# Patient Record
Sex: Male | Born: 1955 | Race: Black or African American | Hispanic: No | Marital: Married | Smoking: Current every day smoker
Health system: Southern US, Community
[De-identification: ages and names within clinical notes are randomized; demographics above are authoritative.]

## PROBLEM LIST (undated history)

## (undated) DIAGNOSIS — K2270 Barrett's esophagus without dysplasia: Secondary | ICD-10-CM

## (undated) DIAGNOSIS — I428 Other cardiomyopathies: Secondary | ICD-10-CM

## (undated) DIAGNOSIS — K219 Gastro-esophageal reflux disease without esophagitis: Secondary | ICD-10-CM

## (undated) DIAGNOSIS — Z8719 Personal history of other diseases of the digestive system: Secondary | ICD-10-CM

## (undated) DIAGNOSIS — I6523 Occlusion and stenosis of bilateral carotid arteries: Secondary | ICD-10-CM

## (undated) DIAGNOSIS — N289 Disorder of kidney and ureter, unspecified: Secondary | ICD-10-CM

## (undated) DIAGNOSIS — N201 Calculus of ureter: Secondary | ICD-10-CM

## (undated) DIAGNOSIS — N400 Enlarged prostate without lower urinary tract symptoms: Secondary | ICD-10-CM

## (undated) DIAGNOSIS — Z87438 Personal history of other diseases of male genital organs: Secondary | ICD-10-CM

## (undated) DIAGNOSIS — R350 Frequency of micturition: Secondary | ICD-10-CM

## (undated) DIAGNOSIS — M1990 Unspecified osteoarthritis, unspecified site: Secondary | ICD-10-CM

## (undated) DIAGNOSIS — R3915 Urgency of urination: Secondary | ICD-10-CM

## (undated) DIAGNOSIS — E785 Hyperlipidemia, unspecified: Secondary | ICD-10-CM

## (undated) HISTORY — DX: Gastro-esophageal reflux disease without esophagitis: K21.9

## (undated) HISTORY — DX: Unspecified osteoarthritis, unspecified site: M19.90

## (undated) HISTORY — DX: Hyperlipidemia, unspecified: E78.5

---

## 1995-02-02 HISTORY — PX: INGUINAL HERNIA REPAIR: SUR1180

## 1997-12-27 ENCOUNTER — Encounter: Payer: Self-pay | Admitting: *Deleted

## 1997-12-27 ENCOUNTER — Emergency Department (HOSPITAL_COMMUNITY): Admission: EM | Admit: 1997-12-27 | Discharge: 1997-12-27 | Payer: Self-pay | Admitting: *Deleted

## 2000-04-18 ENCOUNTER — Other Ambulatory Visit: Admission: RE | Admit: 2000-04-18 | Discharge: 2000-04-18 | Payer: Self-pay | Admitting: Internal Medicine

## 2005-09-13 ENCOUNTER — Emergency Department (HOSPITAL_COMMUNITY): Admission: EM | Admit: 2005-09-13 | Discharge: 2005-09-13 | Payer: Self-pay | Admitting: Family Medicine

## 2006-06-03 ENCOUNTER — Ambulatory Visit: Payer: Self-pay | Admitting: Internal Medicine

## 2006-06-22 ENCOUNTER — Encounter: Payer: Self-pay | Admitting: Internal Medicine

## 2006-06-22 ENCOUNTER — Ambulatory Visit: Payer: Self-pay | Admitting: Internal Medicine

## 2007-04-28 ENCOUNTER — Ambulatory Visit: Payer: Self-pay | Admitting: Internal Medicine

## 2007-05-04 ENCOUNTER — Ambulatory Visit: Payer: Self-pay | Admitting: Internal Medicine

## 2007-05-04 ENCOUNTER — Encounter: Payer: Self-pay | Admitting: Internal Medicine

## 2007-06-07 ENCOUNTER — Ambulatory Visit: Payer: Self-pay | Admitting: Internal Medicine

## 2007-06-07 DIAGNOSIS — K759 Inflammatory liver disease, unspecified: Secondary | ICD-10-CM | POA: Insufficient documentation

## 2007-06-07 DIAGNOSIS — K219 Gastro-esophageal reflux disease without esophagitis: Secondary | ICD-10-CM | POA: Insufficient documentation

## 2007-06-07 DIAGNOSIS — Z860100 Personal history of colon polyps, unspecified: Secondary | ICD-10-CM | POA: Insufficient documentation

## 2007-06-07 DIAGNOSIS — Z8719 Personal history of other diseases of the digestive system: Secondary | ICD-10-CM

## 2007-06-07 DIAGNOSIS — N400 Enlarged prostate without lower urinary tract symptoms: Secondary | ICD-10-CM

## 2007-06-07 LAB — CONVERTED CEMR LAB
ALT: 27 units/L (ref 0–53)
AST: 21 units/L (ref 0–37)
Albumin: 3.8 g/dL (ref 3.5–5.2)
Alkaline Phosphatase: 54 units/L (ref 39–117)
BUN: 8 mg/dL (ref 6–23)
Basophils Absolute: 0 10*3/uL (ref 0.0–0.1)
Basophils Relative: 0.6 % (ref 0.0–1.0)
Bilirubin Urine: NEGATIVE
Bilirubin, Direct: 0.2 mg/dL (ref 0.0–0.3)
CO2: 30 meq/L (ref 19–32)
Calcium: 9.2 mg/dL (ref 8.4–10.5)
Chloride: 108 meq/L (ref 96–112)
Cholesterol: 210 mg/dL (ref 0–200)
Creatinine, Ser: 0.9 mg/dL (ref 0.4–1.5)
Direct LDL: 155.5 mg/dL
Eosinophils Absolute: 0.2 10*3/uL (ref 0.0–0.7)
Eosinophils Relative: 2.6 % (ref 0.0–5.0)
GFR calc Af Amer: 115 mL/min
GFR calc non Af Amer: 95 mL/min
Glucose, Bld: 84 mg/dL (ref 70–99)
HCT: 42.7 % (ref 39.0–52.0)
HDL: 31 mg/dL — ABNORMAL LOW (ref >39.0)
Hemoglobin, Urine: NEGATIVE
Hemoglobin: 14.9 g/dL (ref 13.0–17.0)
Ketones, ur: NEGATIVE mg/dL
Leukocytes, UA: NEGATIVE
Lymphocytes Relative: 31.9 % (ref 12.0–46.0)
MCHC: 34.8 g/dL (ref 30.0–36.0)
MCV: 94.4 fL (ref 78.0–100.0)
Monocytes Absolute: 0.4 10*3/uL (ref 0.1–1.0)
Monocytes Relative: 5.3 % (ref 3.0–12.0)
Neutro Abs: 5 10*3/uL (ref 1.4–7.7)
Neutrophils Relative %: 59.6 % (ref 43.0–77.0)
Nitrite: NEGATIVE
PSA: 0.69 ng/mL (ref 0.10–4.00)
Platelets: 235 10*3/uL (ref 150–400)
Potassium: 3.9 meq/L (ref 3.5–5.1)
RBC: 4.52 M/uL (ref 4.22–5.81)
RDW: 12.1 % (ref 11.5–14.6)
Sodium: 141 meq/L (ref 135–145)
Specific Gravity, Urine: <=1.005 (ref 1.000–1.03)
TSH: 0.52 microintl units/mL (ref 0.35–5.50)
Total Bilirubin: 1.3 mg/dL — ABNORMAL HIGH (ref 0.3–1.2)
Total CHOL/HDL Ratio: 6.8
Total Protein, Urine: NEGATIVE mg/dL
Total Protein: 6.6 g/dL (ref 6.0–8.3)
Triglycerides: 79 mg/dL (ref 0–149)
Urine Glucose: NEGATIVE mg/dL
Urobilinogen, UA: 0.2 (ref 0.0–1.0)
VLDL: 16 mg/dL (ref 0–40)
WBC: 8.2 10*3/uL (ref 4.5–10.5)
pH: 6 (ref 5.0–8.0)

## 2008-03-27 ENCOUNTER — Ambulatory Visit: Payer: Self-pay | Admitting: Internal Medicine

## 2008-03-27 DIAGNOSIS — J069 Acute upper respiratory infection, unspecified: Secondary | ICD-10-CM | POA: Insufficient documentation

## 2008-03-27 DIAGNOSIS — N410 Acute prostatitis: Secondary | ICD-10-CM

## 2008-03-27 DIAGNOSIS — R42 Dizziness and giddiness: Secondary | ICD-10-CM

## 2008-03-27 DIAGNOSIS — E785 Hyperlipidemia, unspecified: Secondary | ICD-10-CM | POA: Insufficient documentation

## 2008-03-27 LAB — CONVERTED CEMR LAB
ALT: 20 units/L (ref 0–53)
AST: 19 units/L (ref 0–37)
Albumin: 4 g/dL (ref 3.5–5.2)
Alkaline Phosphatase: 59 units/L (ref 39–117)
BUN: 11 mg/dL (ref 6–23)
Basophils Absolute: 0.3 10*3/uL — ABNORMAL HIGH (ref 0.0–0.1)
Basophils Relative: 3.6 % — ABNORMAL HIGH (ref 0.0–3.0)
Bilirubin, Direct: 0.1 mg/dL (ref 0.0–0.3)
CO2: 29 meq/L (ref 19–32)
Calcium: 9.3 mg/dL (ref 8.4–10.5)
Chloride: 105 meq/L (ref 96–112)
Creatinine, Ser: 1 mg/dL (ref 0.4–1.5)
Eosinophils Absolute: 0.2 10*3/uL (ref 0.0–0.7)
Eosinophils Relative: 2.5 % (ref 0.0–5.0)
GFR calc Af Amer: 101 mL/min
GFR calc non Af Amer: 84 mL/min
Glucose, Bld: 98 mg/dL (ref 70–99)
HCT: 42.8 % (ref 39.0–52.0)
Hemoglobin: 14.9 g/dL (ref 13.0–17.0)
Lymphocytes Relative: 32.9 % (ref 12.0–46.0)
MCHC: 34.9 g/dL (ref 30.0–36.0)
MCV: 95 fL (ref 78.0–100.0)
Monocytes Absolute: 0.4 10*3/uL (ref 0.1–1.0)
Monocytes Relative: 4.8 % (ref 3.0–12.0)
Neutro Abs: 4.5 10*3/uL (ref 1.4–7.7)
Neutrophils Relative %: 56.2 % (ref 43.0–77.0)
Platelets: 229 10*3/uL (ref 150–400)
Potassium: 4 meq/L (ref 3.5–5.1)
RBC: 4.5 M/uL (ref 4.22–5.81)
RDW: 12.2 % (ref 11.5–14.6)
Sodium: 142 meq/L (ref 135–145)
Total Bilirubin: 0.9 mg/dL (ref 0.3–1.2)
Total Protein: 7.2 g/dL (ref 6.0–8.3)
WBC: 8.1 10*3/uL (ref 4.5–10.5)

## 2008-04-03 ENCOUNTER — Encounter: Payer: Self-pay | Admitting: Internal Medicine

## 2008-04-03 ENCOUNTER — Ambulatory Visit: Payer: Self-pay

## 2008-04-03 HISTORY — PX: CARDIOVASCULAR STRESS TEST: SHX262

## 2008-04-03 HISTORY — PX: TRANSTHORACIC ECHOCARDIOGRAM: SHX275

## 2008-04-04 ENCOUNTER — Encounter: Payer: Self-pay | Admitting: Internal Medicine

## 2008-04-04 DIAGNOSIS — I428 Other cardiomyopathies: Secondary | ICD-10-CM | POA: Insufficient documentation

## 2008-04-04 DIAGNOSIS — I420 Dilated cardiomyopathy: Secondary | ICD-10-CM | POA: Insufficient documentation

## 2008-04-05 ENCOUNTER — Telehealth: Payer: Self-pay | Admitting: Internal Medicine

## 2008-04-09 ENCOUNTER — Telehealth (INDEPENDENT_AMBULATORY_CARE_PROVIDER_SITE_OTHER): Payer: Self-pay | Admitting: *Deleted

## 2008-04-10 ENCOUNTER — Encounter: Payer: Self-pay | Admitting: Cardiology

## 2008-04-10 ENCOUNTER — Ambulatory Visit: Payer: Self-pay

## 2008-05-07 ENCOUNTER — Encounter: Payer: Self-pay | Admitting: Cardiology

## 2008-05-07 ENCOUNTER — Ambulatory Visit: Payer: Self-pay | Admitting: Cardiology

## 2008-05-07 DIAGNOSIS — F17200 Nicotine dependence, unspecified, uncomplicated: Secondary | ICD-10-CM | POA: Insufficient documentation

## 2008-05-08 ENCOUNTER — Ambulatory Visit: Payer: Self-pay | Admitting: Cardiology

## 2008-05-08 LAB — CONVERTED CEMR LAB
ALT: 17 units/L (ref 0–53)
AST: 18 units/L (ref 0–37)
Albumin: 3.5 g/dL (ref 3.5–5.2)
Alkaline Phosphatase: 53 units/L (ref 39–117)
BUN: 10 mg/dL (ref 6–23)
Basophils Absolute: 0 10*3/uL (ref 0.0–0.1)
Basophils Relative: 0.3 % (ref 0.0–3.0)
Bilirubin, Direct: 0.1 mg/dL (ref 0.0–0.3)
CO2: 28 meq/L (ref 19–32)
Calcium: 9.1 mg/dL (ref 8.4–10.5)
Chloride: 112 meq/L (ref 96–112)
Cholesterol: 194 mg/dL (ref 0–200)
Creatinine, Ser: 0.9 mg/dL (ref 0.4–1.5)
Eosinophils Absolute: 0.2 10*3/uL (ref 0.0–0.7)
Eosinophils Relative: 1.9 % (ref 0.0–5.0)
GFR calc non Af Amer: 114.35 mL/min (ref >60)
Glucose, Bld: 108 mg/dL — ABNORMAL HIGH (ref 70–99)
HCT: 42.2 % (ref 39.0–52.0)
HDL: 37.6 mg/dL — ABNORMAL LOW (ref >39.00)
Hemoglobin: 14.4 g/dL (ref 13.0–17.0)
LDL Cholesterol: 139 mg/dL — ABNORMAL HIGH (ref 0–99)
Lymphocytes Relative: 19.8 % (ref 12.0–46.0)
Lymphs Abs: 2 10*3/uL (ref 0.7–4.0)
MCHC: 34.2 g/dL (ref 30.0–36.0)
MCV: 95.9 fL (ref 78.0–100.0)
Monocytes Absolute: 0.6 10*3/uL (ref 0.1–1.0)
Monocytes Relative: 5.8 % (ref 3.0–12.0)
Neutro Abs: 7.5 10*3/uL (ref 1.4–7.7)
Neutrophils Relative %: 72.2 % (ref 43.0–77.0)
Platelets: 189 10*3/uL (ref 150.0–400.0)
Potassium: 3.9 meq/L (ref 3.5–5.1)
Pro B Natriuretic peptide (BNP): 33 pg/mL (ref 0.0–100.0)
RBC: 4.4 M/uL (ref 4.22–5.81)
RDW: 12.1 % (ref 11.5–14.6)
Sodium: 144 meq/L (ref 135–145)
Total Bilirubin: 1.2 mg/dL (ref 0.3–1.2)
Total CHOL/HDL Ratio: 5
Total Protein: 6.8 g/dL (ref 6.0–8.3)
Triglycerides: 85 mg/dL (ref 0.0–149.0)
VLDL: 17 mg/dL (ref 0.0–40.0)
WBC: 10.3 10*3/uL (ref 4.5–10.5)

## 2008-05-13 ENCOUNTER — Telehealth: Payer: Self-pay | Admitting: Cardiology

## 2008-05-14 ENCOUNTER — Ambulatory Visit: Payer: Self-pay | Admitting: Cardiology

## 2008-05-14 LAB — CONVERTED CEMR LAB
INR: 0.9 (ref 0.8–1.0)
Prothrombin Time: 10.1 s — ABNORMAL LOW (ref 10.9–13.3)

## 2008-05-15 ENCOUNTER — Inpatient Hospital Stay (HOSPITAL_BASED_OUTPATIENT_CLINIC_OR_DEPARTMENT_OTHER): Admission: RE | Admit: 2008-05-15 | Discharge: 2008-05-15 | Payer: Self-pay | Admitting: Cardiology

## 2008-05-15 ENCOUNTER — Ambulatory Visit: Payer: Self-pay | Admitting: Cardiology

## 2008-05-15 HISTORY — PX: CARDIAC CATHETERIZATION: SHX172

## 2008-05-16 ENCOUNTER — Telehealth: Payer: Self-pay | Admitting: Cardiology

## 2008-06-26 ENCOUNTER — Encounter (INDEPENDENT_AMBULATORY_CARE_PROVIDER_SITE_OTHER): Payer: Self-pay | Admitting: *Deleted

## 2008-08-02 ENCOUNTER — Telehealth: Payer: Self-pay | Admitting: Internal Medicine

## 2009-04-15 ENCOUNTER — Encounter: Payer: Self-pay | Admitting: Internal Medicine

## 2009-04-15 DIAGNOSIS — I6529 Occlusion and stenosis of unspecified carotid artery: Secondary | ICD-10-CM

## 2009-04-16 ENCOUNTER — Ambulatory Visit: Payer: Self-pay

## 2009-04-16 ENCOUNTER — Encounter: Payer: Self-pay | Admitting: Internal Medicine

## 2009-07-04 ENCOUNTER — Encounter (INDEPENDENT_AMBULATORY_CARE_PROVIDER_SITE_OTHER): Payer: Self-pay | Admitting: *Deleted

## 2010-03-03 NOTE — Letter (Signed)
Summary: Endoscopy Letter  [facility]  [address] [place], [place] [address]   Phone: [phone]  Fax: [phone]      July 04, 2009 MRN: [id number]   Jeffery Frye [address] [place], [place]  [address]   Dear [name],   According to your medical record, it is time for you to schedule an Endoscopy. Endoscopic screening is recommended for patients with certain upper digestive tract conditions because of associated increased risk for cancers of the upper digestive system.  This letter has been generated based on the recommendations made at the time of your prior procedure. If you feel that in your particular situation this may no longer apply, please contact our office.  Please call our office at [phone]) to schedule this appointment or to update your records at your earliest convenience.  Thank you for cooperating with [place] to provide you with the very best care possible.   Sincerely,  Wilhemina Bonito. Marina Goodell, M.D.  [facility] [phone]

## 2010-03-03 NOTE — Progress Notes (Signed)
Summary: questin when he can return to work.  Phone Note Call from Patient Call back at [phone]   Summary of Call: when can he take off bandages from procedure yesterday/lg Initial call taken by: Omer Jack,  May 16, 2008 2:11 PM  Follow-up for Phone Call        Pt. post cardiac cath done 05/15/08  Let pt. know he can remove groing bandage now replace it with a bandaid. Pt. has question when he can return to work Ollen Gross, RN, BSN  May 16, 2008 2:34 PM   Additional Follow-up for Phone Call Additional follow up Details #1::        Lm for pt to call me back. Pt can return to work Monday April 19,2010. Note left at desk for pt to return to work Katina Dung, RN, BSN  May 17, 2008 5:05 PM  talked with patient-aware he can return to [facility] April 19,2010  Additional Follow-up by: Katina Dung, RN, BSN,  May 20, 2008 12:00 PM

## 2010-03-03 NOTE — Miscellaneous (Signed)
Summary: Orders Update  Clinical Lists Changes  Problems: Added new problem of CAROTID ARTERY DISEASE (ICD-433.10) Orders: Added new Test order of Carotid Duplex (Carotid Duplex) - Signed 

## 2010-06-16 NOTE — Cardiovascular Report (Signed)
NAME:  CUAHUTEMOC, ATTAR NO.:  [id number]   MEDICAL RECORD NO.:  [id number]          PATIENT TYPE:  OIB   LOCATION:  1963                         FACILITY:  [facility]   PHYSICIAN:  Marca Ancona, MD      DATE OF BIRTH:  13-Oct-1955   DATE OF PROCEDURE:  DATE OF DISCHARGE:  05/15/2008                            CARDIAC CATHETERIZATION   PRIMARY CARE PHYSICIAN:  Corwin Levins, MD   INDICATIONS:  This is a 55 year old with a history of mildly depressed  LV systolic function and a fixed inferior defect on Myoview.  We are  proceeding with his heart catheterization to rule out coronary artery  disease as a cause of this mild cardiomyopathy.   PROCEDURE:  1. Left heart catheterization.  2. Coronary angiography  3. Left ventriculography.   PROCEDURE NOTE:  After informed consent was obtained, the right groin  was sterilely prepped and draped.  A 4-French arterial sheath was placed  using Seldinger technique in the right common femoral artery.  The left  coronary artery was engaged using the 4-French JL-4 catheter.  The right  coronary artery was engaged using the 4-French 3 DRC catheter and the  left ventricle was entered using the 4-French angled pigtail catheter.   FINDINGS:  1. Hemodynamics:  LV 102/14/15, aorta 100/65.  2. Left ventriculography:  EF was estimated to be 40-45%.  There was      mild global hypokinesis.  This did appear possibly worse      inferiorly.  There was no significant mitral regurgitation.  3. Coronary angiography:  The coronary system is right dominant.      There was no angiographic coronary artery disease noted.   ASSESSMENT/PLAN:  This is a 55 year old with a history of a mildly  depressed left ventricular systolic function and an inferior defect on  Myoview.  Heart catheterization shows no angiographic coronary artery  disease.  The left ventriculogram shows mildly depressed global left  ventricular systolic function with an ejection  fraction estimated to be  40-45%.  The patient does appear to have a nonischemic cardiomyopathy.  He should continue on his carvedilol.  We will likely start him on an  ACE inhibitor at a low dose at our next appointment, and I will see him  back in 10-14 days.  His HIV, ANA, and SPEP of note have been negative.  We may consider for cardiac MRI if sarcoid is felt to be likely, but I  will do further evaluation before this.     Marca Ancona, MD  Electronically Signed    DM/MEDQ  D:  05/15/2008  T:  05/16/2008  Job:  [id number]   cc:   Corwin Levins, MD

## 2010-06-19 NOTE — Assessment & Plan Note (Signed)
Jeffery Frye                         GASTROENTEROLOGY OFFICE NOTE   Doctor, Sheahan JACOBEY GURA                     MRN:          [id number]  DATE:06/03/2006                            DOB:          1955-05-15    HISTORY:  Jeffery Frye presents today requesting prescription for  Protonix.  He is a 55 year old gentleman who was followed in this office  remotely for gastroesophageal reflux disease, and possible Barrett's  esophagus.  Upper endoscopy April 15, 2000 showed a nodule at the  gastroesophageal junction, which was biopsied, and was said to show  intestinal metaplasia consistent with Barrett's esophagus, and  associated with glandular atypia.  His proton pump inhibitor was  increased, and followup endoscopy in July of 2003 revealed benign  gastric mucosa without Barrett's.  Followup in 2 years recommended.  The  patient had not been seen since that time, and had been last followup.  He received a recall letter, but failed to reschedule.  He has been off  proton pump inhibitors for some time.  Recently developed problems with  chest pain.  Went to Urgent Care, and was told this was due to reflux.  He was placed on Protonix, which has ameliorated his symptoms.  He  requests a refill.  He denies dysphagia.  He has had some slight weight  gain.  No other significant medical problems or active GI symptoms.  He  does mention that he is interested in having a screening colonoscopy at  age 35.  He denies change in bowel habits or bleeding.  There is no  family history of colon cancer.   PAST MEDICAL HISTORY:  Reflux disease.   PAST SURGICAL HISTORY:  None.   CURRENT MEDICATIONS:  Protonix 40 mg daily.   ALLERGIES:  NO KNOWN DRUG ALLERGIES.   FAMILY HISTORY:  Negative for gastrointestinal malignancy.  Father with  heart disease.   SOCIAL HISTORY:  The patient smokes and uses alcohol.  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   REVIEW OF SYSTEMS:  Per diagnostic evaluation form.   PHYSICAL EXAMINATION:  Well-appearing male, in no acute distress.  Blood pressure 118/82.  Heart rate is 80.  Weight is 208 pounds.  He is  5 feet 9 inches in height.  HEENT:  Sclerae anicteric.  Conjunctivae are pink.  Oral mucosa intact.  No adenopathy.  LUNGS:  Clear.  HEART:  Regular.  ABDOMEN:  Soft and non-tender.  There is no mass or hernia.   IMPRESSION:  1. Gastroesophageal reflux disease.  Prior endoscopy with inflammatory      nodule at the gastroesophageal junction, as well, a question of      Barrett's esophagus with atypia.  2. Colonoscopy cancer screening.  Due next year.  Patient aware.   PLAN:  1. Protonix 40 mg daily.  A prescription with multiple refills has      been provided, in addition to a few samples.  2. Schedule upper endoscopy with biopsies to follow up question of      Barrett's.  Ashby Dawes of the procedure, as well  as the risks,      benefits, and alternatives have been reviewed.  He understood, and      agreed to proceed.  3. Screening colonoscopy next year.     Jeffery Frye. Marina Goodell, MD  Electronically Signed    JNP/MedQ  DD: 06/03/2006  DT: 06/03/2006  Job #: [id number]

## 2010-07-08 ENCOUNTER — Other Ambulatory Visit: Payer: Self-pay | Admitting: Cardiology

## 2010-07-16 ENCOUNTER — Other Ambulatory Visit: Payer: Self-pay | Admitting: *Deleted

## 2010-07-16 MED ORDER — SIMVASTATIN 40 MG PO TABS: 40.0000 mg | ORAL_TABLET | Freq: Every evening | ORAL | Status: DC

## 2010-08-19 ENCOUNTER — Telehealth: Payer: Self-pay | Admitting: *Deleted

## 2010-08-19 NOTE — Telephone Encounter (Signed)
08-19-2010 Recall project: called pt's home number [phone], wrong number, [place] residence. Called it twice to be sure same person.  Called mobile number [phone], got answering machine with Manager Caryn Bee. I left a message asking if Mr. Isack Lavalley still works there please have him him call me. Work number we had was [phone].

## 2010-08-27 NOTE — Telephone Encounter (Signed)
The patient did call us on 08-21-2010 and one of our scheduler scheduled him for an EGD on 10-14-2010.  He is also scheduled for a previsit with a nurse on 09-30-10.  Packet has been mailed to the patient from our office.

## 2010-09-21 ENCOUNTER — Inpatient Hospital Stay (INDEPENDENT_AMBULATORY_CARE_PROVIDER_SITE_OTHER)
Admission: RE | Admit: 2010-09-21 | Discharge: 2010-09-21 | Disposition: A | Discharge status: Home / Self Care | Payer: 59 | Source: Ambulatory Visit | Attending: Family Medicine | Admitting: Family Medicine

## 2010-09-21 DIAGNOSIS — M719 Bursopathy, unspecified: Secondary | ICD-10-CM

## 2010-09-21 DIAGNOSIS — M659 Synovitis and tenosynovitis, unspecified: Secondary | ICD-10-CM

## 2010-09-23 ENCOUNTER — Other Ambulatory Visit: Payer: Self-pay | Admitting: *Deleted

## 2010-09-23 MED ORDER — SIMVASTATIN 40 MG PO TABS: 40.0000 mg | ORAL_TABLET | Freq: Every evening | ORAL | Status: DC

## 2010-09-23 NOTE — Telephone Encounter (Signed)
rx sent in rx, pt needs appt to see Dr. Shirlee Latch for follow up since starting simvastatin and for any further refills. Danielle Rankin

## 2010-10-14 ENCOUNTER — Other Ambulatory Visit: Payer: Self-pay | Admitting: Internal Medicine

## 2010-10-21 ENCOUNTER — Encounter: Payer: Self-pay | Admitting: Internal Medicine

## 2010-10-27 ENCOUNTER — Other Ambulatory Visit: Payer: Self-pay | Admitting: *Deleted

## 2010-10-27 MED ORDER — SIMVASTATIN 40 MG PO TABS: 40.0000 mg | ORAL_TABLET | Freq: Every evening | ORAL | Status: DC

## 2010-11-03 ENCOUNTER — Other Ambulatory Visit: Payer: Self-pay | Admitting: Cardiology

## 2010-11-06 ENCOUNTER — Ambulatory Visit (AMBULATORY_SURGERY_CENTER): Payer: 59 | Admitting: *Deleted

## 2010-11-06 ENCOUNTER — Encounter: Payer: Self-pay | Admitting: Internal Medicine

## 2010-11-06 VITALS — Ht 72.0 in | Wt 217.0 lb

## 2010-11-06 DIAGNOSIS — K2270 Barrett's esophagus without dysplasia: Secondary | ICD-10-CM

## 2010-11-20 ENCOUNTER — Encounter: Payer: Self-pay | Admitting: Internal Medicine

## 2010-11-20 ENCOUNTER — Ambulatory Visit (AMBULATORY_SURGERY_CENTER): Payer: 59 | Admitting: Internal Medicine

## 2010-11-20 VITALS — BP 123/48 | HR 69 | Temp 96.6°F | Resp 17 | Ht 72.0 in | Wt 217.0 lb

## 2010-11-20 DIAGNOSIS — K209 Esophagitis, unspecified: Secondary | ICD-10-CM

## 2010-11-20 DIAGNOSIS — K2980 Duodenitis without bleeding: Secondary | ICD-10-CM

## 2010-11-20 DIAGNOSIS — K2900 Acute gastritis without bleeding: Secondary | ICD-10-CM

## 2010-11-20 DIAGNOSIS — K2270 Barrett's esophagus without dysplasia: Secondary | ICD-10-CM

## 2010-11-20 DIAGNOSIS — K219 Gastro-esophageal reflux disease without esophagitis: Secondary | ICD-10-CM

## 2010-11-20 MED ORDER — SODIUM CHLORIDE 0.9 % IV SOLN: 500.0000 mL | INTRAVENOUS | Status: DC

## 2010-11-20 NOTE — Patient Instructions (Signed)
Discharge instructions given with verbal understanding.  Handouts on Barretts, GERD, and a soft diet given.  Resume previous medications.

## 2010-11-23 ENCOUNTER — Telehealth: Payer: Self-pay | Admitting: *Deleted

## 2010-11-23 DIAGNOSIS — K2900 Acute gastritis without bleeding: Secondary | ICD-10-CM

## 2010-11-23 LAB — HELICOBACTER PYLORI SCREEN-BIOPSY: UREASE: NEGATIVE

## 2010-11-23 NOTE — Telephone Encounter (Signed)

## 2011-03-13 ENCOUNTER — Emergency Department (HOSPITAL_COMMUNITY)
Admission: EM | Admit: 2011-03-13 | Discharge: 2011-03-13 | Disposition: A | Discharge status: Home / Self Care | Payer: 59 | Source: Home / Self Care | Attending: Emergency Medicine | Admitting: Emergency Medicine

## 2011-03-13 ENCOUNTER — Encounter (HOSPITAL_COMMUNITY): Payer: Self-pay | Admitting: *Deleted

## 2011-03-13 DIAGNOSIS — J069 Acute upper respiratory infection, unspecified: Secondary | ICD-10-CM

## 2011-03-13 MED ORDER — ALBUTEROL SULFATE HFA 108 (90 BASE) MCG/ACT IN AERS: 1.0000 | INHALATION_SPRAY | Freq: Four times a day (QID) | RESPIRATORY_TRACT | Status: DC | PRN

## 2011-03-13 NOTE — ED Notes (Signed)
Pt with c/o congestion/cough/fever/bodyaches onset Monday night - fever /bodyaches resolved wed/thurs - cough and congestion continues - sneezing - yellowish sinus drainage

## 2011-03-13 NOTE — ED Provider Notes (Signed)
History     CSN: [id number]  Arrival date & time 03/13/11  1234   First MD Initiated Contact with Patient 03/13/11 1359      Chief Complaint  Patient presents with  . Nasal Congestion    pt with c/o congestion/cough/fever/bodyaches onset monday night - fever/bodyaches resolved wed or thurs - cough and congestion continues/sneezing /yellowish sinus drainage - taking mucinex dm with some relief   . Cough  . Fever  . Generalized Body Aches    (Consider location/radiation/quality/duration/timing/severity/associated sxs/prior treatment) HPI Comments: Pt with rhinorrhea, postnasal drip, ST, nonproductive cough, fatigue. bodyaches x 5 days. Reports some wheezing, but no chest pain or shortness of breath. States that he felt feverish at the beginning the illness, but no measured fevers at home. This has largely resolved, however, he complains of continued cough and wheezing. No ear pain, abd pain, rash, N/V. Slightly decreased appetite but is tolerating po.  Is taking Mucinex DM for cough with symptomatic relief, and states that he is able to sleep at night without problem.  ROS as noted in HPI. All other ROS negative.       Patient is a 56 y.o. male presenting with cough and fever. The history is provided by the patient. No language interpreter was used.  Cough This is a new problem. The current episode started more than 2 days ago. The cough is non-productive. He is a smoker. His past medical history does not include COPD, emphysema or asthma.  Fever Primary symptoms of the febrile illness include fever and cough.    Past Medical History  Diagnosis Date  . GERD (gastroesophageal reflux disease)   . Hyperlipidemia   . CAD (coronary artery disease)   . Arthritis     Past Surgical History  Procedure Date  . Inguinal hernia repair 1997    right    Family History  Problem Relation Age of Onset  . Colon polyps Neg Hx   . Colon cancer Neg Hx   . Rectal cancer Neg Hx   .  Stomach cancer Neg Hx     History  Substance Use Topics  . Smoking status: Current Everyday Smoker -- 1.0 packs/day    Types: Cigarettes  . Smokeless tobacco: Not on file  . Alcohol Use: 1.8 oz/week    3 Cans of beer per week      Review of Systems  Constitutional: Positive for fever.  Respiratory: Positive for cough.     Allergies  Review of patient's allergies indicates no known allergies.  Home Medications   Current Outpatient Rx  Name Route Sig Dispense Refill  . MUCINEX DM PO Oral Take by mouth.    . ALBUTEROL SULFATE HFA 108 (90 BASE) MCG/ACT IN AERS Inhalation Inhale 1-2 puffs into the lungs every 6 (six) hours as needed for wheezing. 1 Inhaler 0  . ASPIRIN 81 MG PO TABS Oral Take 81 mg by mouth daily.      Marland Kitchen CARVEDILOL 6.25 MG PO TABS  TAKE ONE TABLET TWO TIMES A DAY 60 tablet 1  . ONE-DAILY MULTI VITAMINS PO TABS Oral Take 1 tablet by mouth daily.      Marland Kitchen OMEPRAZOLE 20 MG PO CPDR Oral Take 20 mg by mouth daily.      Marland Kitchen SIMVASTATIN 40 MG PO TABS Oral Take 1 tablet (40 mg total) by mouth every evening. 15 tablet 0    Pt needs appt to see Dr. Shirlee Latch for follow up sinc ...    BP 115/86  Pulse 80  Temp(Src) 98.4 F (36.9 C) (Oral)  Resp 18  SpO2 97%  Physical Exam  Nursing note and vitals reviewed. Constitutional: He is oriented to person, place, and time. He appears well-developed and well-nourished.  HENT:  Head: Normocephalic and atraumatic.  Right Ear: Tympanic membrane normal.  Left Ear: Tympanic membrane normal.  Nose: Mucosal edema and rhinorrhea present. No epistaxis.  Mouth/Throat: Uvula is midline and mucous membranes are normal. Posterior oropharyngeal erythema present. No oropharyngeal exudate.       No sinus tenderness  Eyes: Conjunctivae and EOM are normal.  Neck: Normal range of motion. Neck supple.  Cardiovascular: Normal rate, regular rhythm and normal heart sounds.   Pulmonary/Chest: Effort normal and breath sounds normal. No respiratory  distress.  Abdominal: Soft. Bowel sounds are normal. He exhibits no distension. There is no tenderness.  Musculoskeletal: Normal range of motion. He exhibits no edema and no tenderness.  Lymphadenopathy:    He has no cervical adenopathy.  Neurological: He is alert and oriented to person, place, and time.  Skin: Skin is warm and dry. No rash noted.  Psychiatric: He has a normal mood and affect. His behavior is normal. Judgment and thought content normal.    ED Course  Procedures (including critical care time)  Labs Reviewed - No data to display No results found.   1. URI (upper respiratory infection)       MDM  Patient is a smoker, will send home with albuterol to help with his cough. Will have him continue Mucinex DM, increase his fluids. Follow up with Dr. as needed.  Luiz Blare, MD 03/13/11 [phone]

## 2012-03-17 ENCOUNTER — Emergency Department (HOSPITAL_COMMUNITY)
Admission: EM | Admit: 2012-03-17 | Discharge: 2012-03-17 | Disposition: A | Discharge status: Home / Self Care | Payer: 59 | Source: Home / Self Care | Attending: Family Medicine | Admitting: Family Medicine

## 2012-03-17 ENCOUNTER — Encounter (HOSPITAL_COMMUNITY): Payer: Self-pay | Admitting: Emergency Medicine

## 2012-03-17 DIAGNOSIS — J329 Chronic sinusitis, unspecified: Secondary | ICD-10-CM

## 2012-03-17 DIAGNOSIS — J40 Bronchitis, not specified as acute or chronic: Secondary | ICD-10-CM

## 2012-03-17 MED ORDER — ALBUTEROL SULFATE HFA 108 (90 BASE) MCG/ACT IN AERS: 1.0000 | INHALATION_SPRAY | Freq: Four times a day (QID) | RESPIRATORY_TRACT | Status: DC | PRN

## 2012-03-17 MED ORDER — AMOXICILLIN 500 MG PO CAPS: 500.0000 mg | ORAL_CAPSULE | Freq: Three times a day (TID) | ORAL | Status: DC

## 2012-03-17 MED ORDER — HYDROCODONE-ACETAMINOPHEN 7.5-325 MG/15ML PO SOLN: 10.0000 mL | Freq: Three times a day (TID) | ORAL | Status: DC | PRN

## 2012-03-17 MED ORDER — PREDNISONE 20 MG PO TABS: ORAL_TABLET | ORAL | Status: DC

## 2012-03-17 MED ORDER — IBUPROFEN 600 MG PO TABS: 600.0000 mg | ORAL_TABLET | Freq: Three times a day (TID) | ORAL | Status: DC | PRN

## 2012-03-17 NOTE — ED Provider Notes (Signed)
History     CSN: [id number]  Arrival date & time 03/17/12  1145   First MD Initiated Contact with Patient 03/17/12 1224      Chief Complaint  Patient presents with  . URI    (Consider location/radiation/quality/duration/timing/severity/associated sxs/prior treatment) HPI Comments: 57 year old male nondiabetic in the process of quitting cigarette smoking. Here complaining of nasal congestion, sinus pressure, headache and thick yellow-green rhinorrhea worse during the last week although he has had milder symptoms for about 2 weeks. Symptoms associated with headache and decreased appetite. Stated he has felt warm but denies chills or diaphoresis. Patient stated he wakes up in the morning coughing a lot of thick mucus. Denies chest pain or shortness of breath. He has had some wheezing at nighttime. Patient is using any electronic cigarette patient stated he is mixing non-nicotine and nicotine containing cartridges to blindly insert on his electronic cigarette device to wean himself off smoking.   Past Medical History  Diagnosis Date  . GERD (gastroesophageal reflux disease)   . Hyperlipidemia   . CAD (coronary artery disease)   . Arthritis     Past Surgical History  Procedure Laterality Date  . Inguinal hernia repair  1997    right    Family History  Problem Relation Age of Onset  . Colon polyps Neg Hx   . Colon cancer Neg Hx   . Rectal cancer Neg Hx   . Stomach cancer Neg Hx     History  Substance Use Topics  . Smoking status: Current Every Day Smoker -- 1.00 packs/day    Types: Cigarettes  . Smokeless tobacco: Not on file  . Alcohol Use: 1.8 oz/week    3 Cans of beer per week      Review of Systems  Constitutional: Negative for fever and chills.  HENT: Positive for congestion, rhinorrhea and sinus pressure.   Eyes: Negative for pain and discharge.  Respiratory: Positive for cough and wheezing.   Cardiovascular: Negative for chest pain, palpitations and leg  swelling.  Gastrointestinal: Negative for nausea, vomiting and diarrhea.  Skin: Negative for rash.  Neurological: Positive for headaches. Negative for dizziness.  All other systems reviewed and are negative.    Allergies  Review of patient's allergies indicates no known allergies.  Home Medications   Current Outpatient Rx  Name  Route  Sig  Dispense  Refill  . EXPIRED: albuterol (PROVENTIL HFA;VENTOLIN HFA) 108 (90 BASE) MCG/ACT inhaler   Inhalation   Inhale 1-2 puffs into the lungs every 6 (six) hours as needed for wheezing.   1 Inhaler   0   . albuterol (PROVENTIL HFA;VENTOLIN HFA) 108 (90 BASE) MCG/ACT inhaler   Inhalation   Inhale 1-2 puffs into the lungs every 6 (six) hours as needed for wheezing.   1 Inhaler   0   . amoxicillin (AMOXIL) 500 MG capsule   Oral   Take 1 capsule (500 mg total) by mouth 3 (three) times daily.   21 capsule   0   . aspirin 81 MG tablet   Oral   Take 81 mg by mouth daily.           . carvedilol (COREG) 6.25 MG tablet      TAKE ONE TABLET TWO TIMES A DAY   60 tablet   1   . Dextromethorphan-Guaifenesin (MUCINEX DM PO)   Oral   Take by mouth.         Marland Kitchen HYDROcodone-acetaminophen (HYCET) 7.5-325 mg/15 ml solution   Oral  Take 10 mLs by mouth every 8 (eight) hours as needed for pain or cough.   120 mL   0   . ibuprofen (ADVIL,MOTRIN) 600 MG tablet   Oral   Take 1 tablet (600 mg total) by mouth every 8 (eight) hours as needed for pain.   20 tablet   0   . Multiple Vitamin (MULTIVITAMIN) tablet   Oral   Take 1 tablet by mouth daily.           Marland Kitchen omeprazole (PRILOSEC) 20 MG capsule   Oral   Take 20 mg by mouth daily.           . predniSONE (DELTASONE) 20 MG tablet      2 tabs by mouth daily for 5 days.   10 tablet   0   . EXPIRED: simvastatin (ZOCOR) 40 MG tablet   Oral   Take 1 tablet (40 mg total) by mouth every evening.   15 tablet   0     Pt needs appt to see Dr. Shirlee Latch for follow up sinc ...     BP  126/83  Pulse 94  Temp(Src) 98.3 F (36.8 C) (Oral)  Resp 20  SpO2 96%  Physical Exam  Nursing note and vitals reviewed. Constitutional: He is oriented to person, place, and time. He appears well-developed and well-nourished. No distress.  HENT:  Head: Normocephalic and atraumatic.  Nasal Congestion with erythema and swelling of nasal turbinates, white/yellow rhinorrhea. Post nasal drip. pharyngeal erythema no exudates. No uvula deviation. No trismus. TM's with increased vascular markings no dullness bilaterally no swelling or bulging  Eyes: Conjunctivae and EOM are normal. Pupils are equal, round, and reactive to light. Right eye exhibits no discharge. Left eye exhibits no discharge.  Neck: Neck supple.  Cardiovascular: Normal rate, regular rhythm and normal heart sounds.   Pulmonary/Chest: Effort normal and breath sounds normal. No respiratory distress. He has no wheezes. He has no rales. He exhibits no tenderness.  Lymphadenopathy:    He has no cervical adenopathy.  Neurological: He is alert and oriented to person, place, and time.  Skin: No rash noted. He is not diaphoretic.    ED Course  Procedures (including critical care time)  Labs Reviewed - No data to display No results found.   1. Sinusitis   2. Bronchitis       MDM  Lungs clear on auscultation. No respiratory distress Patient was treated with prednisone, amoxicillin, albuterol , ibuprofen and hydrocodone/acetaminophen. Supportive care and red flags that should prompt his return to medical attention discussed with patient and provided in writing.        Sharin Grave, MD 03/18/12 [phone]

## 2012-03-17 NOTE — ED Notes (Signed)
Pt c/o cold sx x11 days Sx include: cough w/yellow mucous, runny nose, nasal congestion, throbbing headache, facial pressure Denies: f/v/n/d Taking robitussin and OTC cold meds w/little relief  He is alert w/no signs of acute distress.

## 2012-06-15 ENCOUNTER — Encounter: Payer: Self-pay | Admitting: Internal Medicine

## 2012-08-28 ENCOUNTER — Emergency Department (INDEPENDENT_AMBULATORY_CARE_PROVIDER_SITE_OTHER): Payer: 59

## 2012-08-28 ENCOUNTER — Emergency Department (HOSPITAL_COMMUNITY)
Admission: EM | Admit: 2012-08-28 | Discharge: 2012-08-28 | Disposition: A | Discharge status: Home / Self Care | Payer: 59 | Source: Home / Self Care

## 2012-08-28 ENCOUNTER — Encounter (HOSPITAL_COMMUNITY): Payer: Self-pay | Admitting: *Deleted

## 2012-08-28 DIAGNOSIS — R109 Unspecified abdominal pain: Secondary | ICD-10-CM

## 2012-08-28 LAB — POCT I-STAT, CHEM 8
BUN: 9 mg/dL (ref 6–23)
Chloride: 102 mEq/L (ref 96–112)
HCT: 51 % (ref 39.0–52.0)
Potassium: 3.7 mEq/L (ref 3.5–5.1)
Sodium: 141 mEq/L (ref 135–145)

## 2012-08-28 LAB — POCT URINALYSIS DIP (DEVICE)
Bilirubin Urine: NEGATIVE
Glucose, UA: NEGATIVE mg/dL
Leukocytes, UA: NEGATIVE
Nitrite: NEGATIVE
pH: 5.5 (ref 5.0–8.0)

## 2012-08-28 MED ORDER — POLYETHYLENE GLYCOL 3350 17 GM/SCOOP PO POWD: 17.0000 g | Freq: Every day | ORAL | Status: DC

## 2012-08-28 NOTE — ED Notes (Signed)
Pt complaints of lower left abdominal pain on Friday with nausea.  Pt reports vomiting and brown urination on Saturday with multiple BM's sat and sun.  Reports urine is now yellow and abdominal pain has decreased.

## 2012-08-28 NOTE — ED Provider Notes (Signed)
Jeffery Frye is a 57 y.o. male who presents to Urgent Care today for abdominal pain and distention. 3 days prior to presentation patient had intense left lower quadrant abdominal pain with bloating and constipation. He took a laxative and had a bowel movement and felt somewhat better. However later that day he noted brown dark-colored urine. His pain has been improving over the weekend however he continues to experience bloating and discomfort. He had a normal bowel movement this morning without any blood without the aid of a laxative but still continues to feel somewhat bloated. Additionally he notes some nausea but denies any vomiting. He denies any fevers or chills and notes that his urine has returned to normal. He denies any new medications or injury.   No history of liver disease.  Colonoscopy was normal one year ago.  Patient gets frequent EGD for "monitoring". Patient is not sure why. Barrets Esophagus does not sound familiar   PMH reviewed. Hypertension History  Substance Use Topics  . Smoking status: Current Every Day Smoker -- 1.00 packs/day    Types: Cigarettes  . Smokeless tobacco: Not on file  . Alcohol Use: 1.8 oz/week    3 Cans of beer per week   ROS as above Medications reviewed. No current facility-administered medications for this encounter.   Current Outpatient Prescriptions  Medication Sig Dispense Refill  . albuterol (PROVENTIL HFA;VENTOLIN HFA) 108 (90 BASE) MCG/ACT inhaler Inhale 1-2 puffs into the lungs every 6 (six) hours as needed for wheezing.  1 Inhaler  0  . albuterol (PROVENTIL HFA;VENTOLIN HFA) 108 (90 BASE) MCG/ACT inhaler Inhale 1-2 puffs into the lungs every 6 (six) hours as needed for wheezing.  1 Inhaler  0  . amoxicillin (AMOXIL) 500 MG capsule Take 1 capsule (500 mg total) by mouth 3 (three) times daily.  21 capsule  0  . aspirin 81 MG tablet Take 81 mg by mouth daily.        . carvedilol (COREG) 6.25 MG tablet TAKE ONE TABLET TWO TIMES A DAY  60  tablet  1  . Dextromethorphan-Guaifenesin (MUCINEX DM PO) Take by mouth.      Marland Kitchen HYDROcodone-acetaminophen (HYCET) 7.5-325 mg/15 ml solution Take 10 mLs by mouth every 8 (eight) hours as needed for pain or cough.  120 mL  0  . ibuprofen (ADVIL,MOTRIN) 600 MG tablet Take 1 tablet (600 mg total) by mouth every 8 (eight) hours as needed for pain.  20 tablet  0  . Multiple Vitamin (MULTIVITAMIN) tablet Take 1 tablet by mouth daily.        Marland Kitchen omeprazole (PRILOSEC) 20 MG capsule Take 20 mg by mouth daily.        . polyethylene glycol powder (MIRALAX) powder Take 17 g by mouth daily.  850 g  0  . predniSONE (DELTASONE) 20 MG tablet 2 tabs by mouth daily for 5 days.  10 tablet  0  . simvastatin (ZOCOR) 40 MG tablet Take 1 tablet (40 mg total) by mouth every evening.  15 tablet  0    Exam:  BP 138/91  Pulse 78  Temp(Src) 97.1 F (36.2 C) (Oral)  Resp 18  SpO2 96% Gen: Well NAD HEENT: EOMI,  MMM Lungs: CTABL Nl WOB Heart: RRR no MRG Abd: NABS, nontender mildly distended. Positive fluid wave. Exts: Non edematous BL  LE, warm and well perfused.   Results for orders placed during the hospital encounter of 08/28/12 (from the past 24 hour(s))  POCT URINALYSIS DIP (DEVICE)  Status: Abnormal   Collection Time    08/28/12  1:58 PM      Result Value Range   Glucose, UA NEGATIVE  NEGATIVE mg/dL   Bilirubin Urine NEGATIVE  NEGATIVE   Ketones, ur TRACE (*) NEGATIVE mg/dL   Specific Gravity, Urine >=1.030  1.005 - 1.030   Hgb urine dipstick LARGE (*) NEGATIVE   pH 5.5  5.0 - 8.0   Protein, ur NEGATIVE  NEGATIVE mg/dL   Urobilinogen, UA 0.2  0.0 - 1.0 mg/dL   Nitrite NEGATIVE  NEGATIVE   Leukocytes, UA NEGATIVE  NEGATIVE  POCT I-STAT, CHEM 8     Status: Abnormal   Collection Time    08/28/12  2:15 PM      Result Value Range   Sodium 141  135 - 145 mEq/L   Potassium 3.7  3.5 - 5.1 mEq/L   Chloride 102  96 - 112 mEq/L   BUN 9  6 - 23 mg/dL   Creatinine, Ser 9.56  0.50 - 1.35 mg/dL   Glucose,  Bld 95  70 - 99 mg/dL   Calcium, Ion 2.13 (*) 1.12 - 1.23 mmol/L   TCO2 27  0 - 100 mmol/L   Hemoglobin 17.3 (*) 13.0 - 17.0 g/dL   HCT 08.6  57.8 - 46.9 %   Dg Abd 1 View  08/28/2012   *RADIOLOGY REPORT*  Clinical Data: Generalized abdominal pain  ABDOMEN - 1 VIEW  Comparison: None.  Findings: Normal bowel gas pattern. Presence or absence of air fluid levels or free air cannot be assessed on this single supine view.  No abnormal calcific opacity.  No acute osseous finding.  IMPRESSION: No acute abdominal abnormality.   Original Report Authenticated By: Christiana Pellant, M.D.    Assessment and Plan: 57 y.o. male with abdominal pain with hematuria.  Possibly passed kidney stone. Labs are largely normal today with the exception of hematuria.  Recommend patient followup with his primary care provider and take MiraLAX as needed daily for constipation.  If he worsens instructed him to go to the emergency room.  Discussed warning signs or symptoms. Please see discharge instructions. Patient expresses understanding.      Rodolph Bong, MD 08/28/12 [phone]

## 2012-08-30 ENCOUNTER — Encounter: Payer: Self-pay | Admitting: Internal Medicine

## 2012-08-30 ENCOUNTER — Ambulatory Visit (INDEPENDENT_AMBULATORY_CARE_PROVIDER_SITE_OTHER): Payer: 59 | Admitting: Internal Medicine

## 2012-08-30 ENCOUNTER — Other Ambulatory Visit: Payer: 59

## 2012-08-30 ENCOUNTER — Other Ambulatory Visit (INDEPENDENT_AMBULATORY_CARE_PROVIDER_SITE_OTHER): Payer: 59

## 2012-08-30 VITALS — BP 120/70 | HR 86 | Temp 97.4°F | Ht 71.0 in | Wt 224.0 lb

## 2012-08-30 DIAGNOSIS — K5900 Constipation, unspecified: Secondary | ICD-10-CM | POA: Insufficient documentation

## 2012-08-30 DIAGNOSIS — R310 Gross hematuria: Secondary | ICD-10-CM

## 2012-08-30 DIAGNOSIS — R109 Unspecified abdominal pain: Secondary | ICD-10-CM | POA: Insufficient documentation

## 2012-08-30 LAB — LIPASE: Lipase: 21 U/L (ref 11.0–59.0)

## 2012-08-30 LAB — CBC WITH DIFFERENTIAL/PLATELET
Basophils Relative: 0.6 % (ref 0.0–3.0)
Eosinophils Relative: 1.6 % (ref 0.0–5.0)
HCT: 46.8 % (ref 39.0–52.0)
MCV: 94.6 fl (ref 78.0–100.0)
Monocytes Relative: 6.3 % (ref 3.0–12.0)
Neutrophils Relative %: 65.2 % (ref 43.0–77.0)
Platelets: 242 10*3/uL (ref 150.0–400.0)
RBC: 4.95 Mil/uL (ref 4.22–5.81)
WBC: 8.9 10*3/uL (ref 4.5–10.5)

## 2012-08-30 LAB — URINALYSIS, ROUTINE W REFLEX MICROSCOPIC
Leukocytes, UA: NEGATIVE
Nitrite: NEGATIVE
Specific Gravity, Urine: >=1.03 (ref 1.000–1.030)
Total Protein, Urine: 30
pH: 5.5 (ref 5.0–8.0)

## 2012-08-30 LAB — BASIC METABOLIC PANEL
BUN: 11 mg/dL (ref 6–23)
Creatinine, Ser: 1.1 mg/dL (ref 0.4–1.5)
GFR: 91.15 mL/min (ref >60.00)
Glucose, Bld: 92 mg/dL (ref 70–99)
Potassium: 4 mEq/L (ref 3.5–5.1)

## 2012-08-30 LAB — HEPATIC FUNCTION PANEL
ALT: 18 U/L (ref 0–53)
AST: 16 U/L (ref 0–37)
Albumin: 4.2 g/dL (ref 3.5–5.2)
Total Bilirubin: 0.8 mg/dL (ref 0.3–1.2)

## 2012-08-30 LAB — SEDIMENTATION RATE: Sed Rate: 18 mm/hr (ref 0–22)

## 2012-08-30 NOTE — Patient Instructions (Signed)
Please continue to not eat for now Please continue all other medications as before, and refills have been done if requested. Please have the pharmacy call with any other refills you may need. Please go to the LAB in the Basement (turn left off the elevator) for the tests to be done today You will be contacted by phone if any changes need to be made immediately.  Otherwise, you will receive a letter about your results with an explanation, but please check with MyChart first.  You will be contacted regarding the referral for: CT scan (please return at 1 PM, and show this to front desk, to then see Corrie Dandy or Gavin Pound)  You will be contacted regarding the referral for: Dr Brunilda Payor  Please return in 3 months, or sooner if needed

## 2012-08-31 ENCOUNTER — Encounter: Payer: Self-pay | Admitting: Internal Medicine

## 2012-08-31 ENCOUNTER — Ambulatory Visit (INDEPENDENT_AMBULATORY_CARE_PROVIDER_SITE_OTHER)
Admission: RE | Admit: 2012-08-31 | Discharge: 2012-08-31 | Disposition: A | Discharge status: Home / Self Care | Payer: 59 | Source: Ambulatory Visit | Attending: Internal Medicine | Admitting: Internal Medicine

## 2012-08-31 DIAGNOSIS — R109 Unspecified abdominal pain: Secondary | ICD-10-CM

## 2012-08-31 DIAGNOSIS — Z0000 Encounter for general adult medical examination without abnormal findings: Secondary | ICD-10-CM | POA: Insufficient documentation

## 2012-08-31 DIAGNOSIS — R310 Gross hematuria: Secondary | ICD-10-CM

## 2012-08-31 DIAGNOSIS — I2510 Atherosclerotic heart disease of native coronary artery without angina pectoris: Secondary | ICD-10-CM | POA: Insufficient documentation

## 2012-08-31 LAB — URINE CULTURE: Colony Count: NO GROWTH

## 2012-08-31 NOTE — Assessment & Plan Note (Signed)
Possible renal stone related vs other such as malignancy,  Will need lab as ordered, urine cx, CT abd/pelvis (no CM), and refer urology

## 2012-08-31 NOTE — Assessment & Plan Note (Signed)
?   Also mild constipation - ok to cont miralax as has been doing,  to f/u any worsening symptoms or concerns

## 2012-08-31 NOTE — Progress Notes (Signed)
Subjective:    Patient ID: Jeffery Frye, male    DOB: August 27, 1955, 57 y.o.   MRN: [id number]  HPI  Here with hx of 5-6 days left sided discomfort, ? Mild feverish, occas n/v, saw brownish urine after working in the yard several days ago, miralax seemed to help constipation, seen at [facility] July 28, plain film neg for stone, blood on UA - dx with possible passed stone.  Felt some better yesterday, but some mild discomfort unusual still persits.  No high fever, chill and no vomiting for several days.  Has seen Dr Nesi/urology in past. Has hx of prostatits, BPH Past Medical History  Diagnosis Date  . GERD (gastroesophageal reflux disease)   . Hyperlipidemia   . CAD (coronary artery disease)   . Arthritis   . CARDIOMYOPATHY 04/04/2008    Qualifier: Diagnosis of  By: Jonny Ruiz MD, Len Blalock   . CAROTID ARTERY DISEASE 04/15/2009    Qualifier: Diagnosis of  By: Maxwell Caul    . GERD 06/07/2007    Qualifier: Diagnosis of  By: Maris Berger   . BARRETT'S ESOPHAGUS, HX OF 06/07/2007    Qualifier: Diagnosis of  By: Maris Berger   . BENIGN PROSTATIC HYPERTROPHY 06/07/2007    Qualifier: Diagnosis of  By: Jonny Ruiz MD, Len Blalock   . Acute prostatitis 03/27/2008    Qualifier: Diagnosis of  By: Jonny Ruiz MD, Len Blalock   . COLONIC POLYPS, HX OF 06/07/2007    Qualifier: Diagnosis of  By: Jonny Ruiz MD, Len Blalock    Past Surgical History  Procedure Laterality Date  . Inguinal hernia repair  1997    right    reports that he has been smoking Cigarettes.  He has been smoking about 1.00 pack per day. He does not have any smokeless tobacco history on file. He reports that he drinks about 1.8 ounces of alcohol per week. He reports that he does not use illicit drugs. family history is negative for Colon polyps, and Colon cancer, and Rectal cancer, and Stomach cancer, . No Known Allergies Current Outpatient Prescriptions on File Prior to Visit  Medication Sig Dispense Refill  . albuterol (PROVENTIL HFA;VENTOLIN HFA) 108  (90 BASE) MCG/ACT inhaler Inhale 1-2 puffs into the lungs every 6 (six) hours as needed for wheezing.  1 Inhaler  0  . aspirin 81 MG tablet Take 81 mg by mouth daily.        Marland Kitchen HYDROcodone-acetaminophen (HYCET) 7.5-325 mg/15 ml solution Take 10 mLs by mouth every 8 (eight) hours as needed for pain or cough.  120 mL  0  . ibuprofen (ADVIL,MOTRIN) 600 MG tablet Take 1 tablet (600 mg total) by mouth every 8 (eight) hours as needed for pain.  20 tablet  0  . Multiple Vitamin (MULTIVITAMIN) tablet Take 1 tablet by mouth daily.        Marland Kitchen omeprazole (PRILOSEC) 20 MG capsule Take 20 mg by mouth daily.        . polyethylene glycol powder (MIRALAX) powder Take 17 g by mouth daily.  850 g  0  . albuterol (PROVENTIL HFA;VENTOLIN HFA) 108 (90 BASE) MCG/ACT inhaler Inhale 1-2 puffs into the lungs every 6 (six) hours as needed for wheezing.  1 Inhaler  0  . carvedilol (COREG) 6.25 MG tablet TAKE ONE TABLET TWO TIMES A DAY  60 tablet  1  . simvastatin (ZOCOR) 40 MG tablet Take 1 tablet (40 mg total) by mouth every evening.  15 tablet  0  No current facility-administered medications on file prior to visit.   Review of Systems  Constitutional: Negative for unexpected weight change, or unusual diaphoresis  HENT: Negative for tinnitus.   Eyes: Negative for photophobia and visual disturbance.  Respiratory: Negative for choking and stridor.   Gastrointestinal: Negative for vomiting and blood in stool.  Genitourinary: Negative for hematuria and decreased urine volume.  Musculoskeletal: Negative for acute joint swelling Skin: Negative for color change and wound.  Neurological: Negative for tremors and numbness other than noted  Psychiatric/Behavioral: Negative for decreased concentration or  hyperactivity.       Objective:   Physical Exam BP 120/70  Pulse 86  Temp(Src) 97.4 F (36.3 C) (Oral)  Ht 5\' 11"  (1.803 m)  Wt 224 lb (101.606 kg)  BMI 31.26 kg/m2  SpO2 94% VS noted, mild uncomfortable  appearing Constitutional: Pt appears well-developed and well-nourished.  HENT: Head: NCAT.  Right Ear: External ear normal.  Left Ear: External ear normal.  Eyes: Conjunctivae and EOM are normal. Pupils are equal, round, and reactive to light.  Neck: Normal range of motion. Neck supple.  Cardiovascular: Normal rate and regular rhythm.   Pulmonary/Chest: Effort normal and breath sounds normal.  Abd:  Soft, non-distended, + BS, minor tender LLQ, no guarding/rebound Neurological: Pt is alert. Not confused  Skin: Skin is warm. No erythema.  Psychiatric: Pt behavior is normal. Thought content normal.     Assessment & Plan:

## 2012-08-31 NOTE — Assessment & Plan Note (Signed)
As above.

## 2012-11-08 ENCOUNTER — Other Ambulatory Visit: Payer: Self-pay | Admitting: Urology

## 2012-11-16 ENCOUNTER — Encounter (HOSPITAL_BASED_OUTPATIENT_CLINIC_OR_DEPARTMENT_OTHER): Payer: Self-pay | Admitting: *Deleted

## 2012-11-17 ENCOUNTER — Encounter (HOSPITAL_BASED_OUTPATIENT_CLINIC_OR_DEPARTMENT_OTHER): Payer: Self-pay | Admitting: *Deleted

## 2012-11-17 ENCOUNTER — Telehealth: Payer: Self-pay | Admitting: Internal Medicine

## 2012-11-17 DIAGNOSIS — G4710 Hypersomnia, unspecified: Secondary | ICD-10-CM

## 2012-11-17 NOTE — Progress Notes (Signed)
NPO AFTER MN. ARRIVES AT 0745. NEEDS HG. MAY TAKE HYDROCODONE IF NEEDED AM DOS W/ SIPS OF WATER.

## 2012-11-17 NOTE — Progress Notes (Signed)
11/17/12 1638  OBSTRUCTIVE SLEEP APNEA  Have you ever been diagnosed with sleep apnea through a sleep study? No  Do you snore loudly (loud enough to be heard through closed doors)?  1  Do you often feel tired, fatigued, or sleepy during the daytime? 1  Has anyone observed you stop breathing during your sleep? 0  Do you have, or are you being treated for high blood pressure? 0  BMI more than 35 kg/m2? 0  Age over 55 years old? 1  Neck circumference greater than 40 cm/18 inches? 0  Gender: 1  Obstructive Sleep Apnea Score 4  Score 4 or greater  Results sent to PCP

## 2012-11-17 NOTE — Telephone Encounter (Signed)
Robin to call pt; recent screening test in the hospital indicates possible sleep apnea  Can I refer to Pulmonary for further evaluation and treatment?

## 2012-11-21 ENCOUNTER — Encounter (HOSPITAL_BASED_OUTPATIENT_CLINIC_OR_DEPARTMENT_OTHER)
Admission: RE | Disposition: A | Discharge status: Home / Self Care | Payer: Self-pay | Source: Ambulatory Visit | Attending: Urology

## 2012-11-21 ENCOUNTER — Ambulatory Visit (HOSPITAL_COMMUNITY): Payer: 59

## 2012-11-21 ENCOUNTER — Ambulatory Visit (HOSPITAL_BASED_OUTPATIENT_CLINIC_OR_DEPARTMENT_OTHER): Payer: 59 | Admitting: Anesthesiology

## 2012-11-21 ENCOUNTER — Encounter (HOSPITAL_BASED_OUTPATIENT_CLINIC_OR_DEPARTMENT_OTHER): Payer: Self-pay | Admitting: *Deleted

## 2012-11-21 ENCOUNTER — Ambulatory Visit (HOSPITAL_BASED_OUTPATIENT_CLINIC_OR_DEPARTMENT_OTHER)
Admission: RE | Admit: 2012-11-21 | Discharge: 2012-11-21 | Disposition: A | Discharge status: Home / Self Care | Payer: 59 | Source: Ambulatory Visit | Attending: Urology | Admitting: Urology

## 2012-11-21 ENCOUNTER — Encounter (HOSPITAL_BASED_OUTPATIENT_CLINIC_OR_DEPARTMENT_OTHER): Payer: 59 | Admitting: Anesthesiology

## 2012-11-21 DIAGNOSIS — I779 Disorder of arteries and arterioles, unspecified: Secondary | ICD-10-CM | POA: Insufficient documentation

## 2012-11-21 DIAGNOSIS — K219 Gastro-esophageal reflux disease without esophagitis: Secondary | ICD-10-CM | POA: Insufficient documentation

## 2012-11-21 DIAGNOSIS — K449 Diaphragmatic hernia without obstruction or gangrene: Secondary | ICD-10-CM | POA: Insufficient documentation

## 2012-11-21 DIAGNOSIS — I428 Other cardiomyopathies: Secondary | ICD-10-CM | POA: Insufficient documentation

## 2012-11-21 DIAGNOSIS — I2510 Atherosclerotic heart disease of native coronary artery without angina pectoris: Secondary | ICD-10-CM | POA: Insufficient documentation

## 2012-11-21 DIAGNOSIS — N35919 Unspecified urethral stricture, male, unspecified site: Secondary | ICD-10-CM | POA: Insufficient documentation

## 2012-11-21 DIAGNOSIS — N201 Calculus of ureter: Secondary | ICD-10-CM | POA: Insufficient documentation

## 2012-11-21 DIAGNOSIS — F17200 Nicotine dependence, unspecified, uncomplicated: Secondary | ICD-10-CM | POA: Insufficient documentation

## 2012-11-21 HISTORY — DX: Other cardiomyopathies: I42.8

## 2012-11-21 HISTORY — DX: Personal history of other diseases of the digestive system: Z87.19

## 2012-11-21 HISTORY — PX: HOLMIUM LASER APPLICATION: SHX5852

## 2012-11-21 HISTORY — DX: Frequency of micturition: R35.0

## 2012-11-21 HISTORY — DX: Urgency of urination: R39.15

## 2012-11-21 HISTORY — DX: Personal history of other diseases of male genital organs: Z87.438

## 2012-11-21 HISTORY — DX: Occlusion and stenosis of bilateral carotid arteries: I65.23

## 2012-11-21 HISTORY — PX: CYSTOSCOPY WITH RETROGRADE PYELOGRAM, URETEROSCOPY AND STENT PLACEMENT: SHX5789

## 2012-11-21 HISTORY — DX: Barrett's esophagus without dysplasia: K22.70

## 2012-11-21 HISTORY — DX: Benign prostatic hyperplasia without lower urinary tract symptoms: N40.0

## 2012-11-21 HISTORY — DX: Calculus of ureter: N20.1

## 2012-11-21 SURGERY — CYSTOURETEROSCOPY, WITH RETROGRADE PYELOGRAM AND STENT INSERTION
Anesthesia: General | Site: Ureter | Laterality: Left | Wound class: Clean Contaminated

## 2012-11-21 MED ORDER — SODIUM CHLORIDE 0.9 % IR SOLN
Status: DC | PRN
  Administered 2012-11-21: 6000 mL

## 2012-11-21 MED ORDER — CEFAZOLIN SODIUM 1-5 GM-% IV SOLN
1.0000 g | INTRAVENOUS | Status: DC
  Filled 2012-11-21: qty 50

## 2012-11-21 MED ORDER — CEFAZOLIN SODIUM-DEXTROSE 2-3 GM-% IV SOLR
2.0000 g | INTRAVENOUS | Status: AC
  Administered 2012-11-21: 2 g via INTRAVENOUS
  Filled 2012-11-21: qty 50

## 2012-11-21 MED ORDER — FENTANYL CITRATE 0.05 MG/ML IJ SOLN
25.0000 ug | INTRAMUSCULAR | Status: DC | PRN
  Filled 2012-11-21: qty 1

## 2012-11-21 MED ORDER — LACTATED RINGERS IV SOLN
INTRAVENOUS | Status: DC
  Filled 2012-11-21: qty 1000

## 2012-11-21 MED ORDER — MIDAZOLAM HCL 5 MG/5ML IJ SOLN
INTRAMUSCULAR | Status: DC | PRN
  Administered 2012-11-21: 2 mg via INTRAVENOUS

## 2012-11-21 MED ORDER — LIDOCAINE HCL (CARDIAC) 20 MG/ML IV SOLN
INTRAVENOUS | Status: DC | PRN
  Administered 2012-11-21: 60 mg via INTRAVENOUS

## 2012-11-21 MED ORDER — ONDANSETRON HCL 4 MG/2ML IJ SOLN
INTRAMUSCULAR | Status: DC | PRN
  Administered 2012-11-21: 4 mg via INTRAVENOUS

## 2012-11-21 MED ORDER — HYDROCODONE-ACETAMINOPHEN 5-325 MG PO TABS: 1.0000 | ORAL_TABLET | Freq: Four times a day (QID) | ORAL | Status: DC | PRN

## 2012-11-21 MED ORDER — KETOROLAC TROMETHAMINE 30 MG/ML IJ SOLN
INTRAMUSCULAR | Status: DC | PRN
  Administered 2012-11-21: 30 mg via INTRAVENOUS

## 2012-11-21 MED ORDER — PROPOFOL 10 MG/ML IV BOLUS
INTRAVENOUS | Status: DC | PRN
  Administered 2012-11-21: 300 mg via INTRAVENOUS

## 2012-11-21 MED ORDER — IOHEXOL 350 MG/ML SOLN
INTRAVENOUS | Status: DC | PRN
  Administered 2012-11-21: 20 mL

## 2012-11-21 MED ORDER — LACTATED RINGERS IV SOLN
INTRAVENOUS | Status: DC
  Administered 2012-11-21 (×2): via INTRAVENOUS
  Filled 2012-11-21: qty 1000

## 2012-11-21 MED ORDER — DEXAMETHASONE SODIUM PHOSPHATE 4 MG/ML IJ SOLN
INTRAMUSCULAR | Status: DC | PRN
  Administered 2012-11-21: 10 mg via INTRAVENOUS

## 2012-11-21 MED ORDER — PROMETHAZINE HCL 25 MG/ML IJ SOLN
12.5000 mg | INTRAMUSCULAR | Status: DC | PRN
  Administered 2012-11-21: 6.25 mg via INTRAVENOUS
  Filled 2012-11-21: qty 1

## 2012-11-21 MED ORDER — FENTANYL CITRATE 0.05 MG/ML IJ SOLN
INTRAMUSCULAR | Status: DC | PRN
  Administered 2012-11-21: 50 ug via INTRAVENOUS
  Administered 2012-11-21 (×2): 25 ug via INTRAVENOUS

## 2012-11-21 SURGICAL SUPPLY — 35 items
ADAPTER CATH URET PLST 4-6FR (CATHETERS) IMPLANT
BAG DRAIN URO-CYSTO SKYTR STRL (DRAIN) ×2 IMPLANT
BASKET LASER NITINOL 1.9FR (BASKET) IMPLANT
BASKET STNLS GEMINI 4WIRE 3FR (BASKET) IMPLANT
BASKET ZERO TIP NITINOL 2.4FR (BASKET) ×2 IMPLANT
BRUSH URET BIOPSY 3F (UROLOGICAL SUPPLIES) IMPLANT
CANISTER SUCT LVC 12 LTR MEDI- (MISCELLANEOUS) ×2 IMPLANT
CATH INTERMIT  6FR 70CM (CATHETERS) IMPLANT
CATH URET 5FR 28IN CONE TIP (BALLOONS) ×1
CATH URET 5FR 28IN OPEN ENDED (CATHETERS) IMPLANT
CATH URET 5FR 70CM CONE TIP (BALLOONS) ×1 IMPLANT
CLOTH BEACON ORANGE TIMEOUT ST (SAFETY) ×2 IMPLANT
DRAPE CAMERA CLOSED 9X96 (DRAPES) ×2 IMPLANT
GLOVE BIO SURGEON STRL SZ 6 (GLOVE) ×2 IMPLANT
GLOVE BIO SURGEON STRL SZ 6.5 (GLOVE) ×2 IMPLANT
GLOVE BIO SURGEON STRL SZ7 (GLOVE) ×2 IMPLANT
GLOVE INDICATOR 6.5 STRL GRN (GLOVE) ×2 IMPLANT
GLOVE INDICATOR 7.0 STRL GRN (GLOVE) ×2 IMPLANT
GOWN STRL NON-REIN LRG LVL3 (GOWN DISPOSABLE) ×2 IMPLANT
GUIDEWIRE 0.038 PTFE COATED (WIRE) ×2 IMPLANT
GUIDEWIRE ANG ZIPWIRE 038X150 (WIRE) IMPLANT
GUIDEWIRE STR DUAL SENSOR (WIRE) ×4 IMPLANT
IV NS IRRIG 3000ML ARTHROMATIC (IV SOLUTION) ×4 IMPLANT
KIT BALLIN UROMAX 15FX10 (LABEL) IMPLANT
KIT BALLN UROMAX 15FX4 (MISCELLANEOUS) IMPLANT
KIT BALLN UROMAX 26 75X4 (MISCELLANEOUS)
LASER FIBER DISP (UROLOGICAL SUPPLIES) ×2 IMPLANT
LASER FIBER DISP 1000U (UROLOGICAL SUPPLIES) IMPLANT
NS IRRIG 500ML POUR BTL (IV SOLUTION) ×2 IMPLANT
PACK CYSTOSCOPY (CUSTOM PROCEDURE TRAY) ×2 IMPLANT
SET HIGH PRES BAL DIL (LABEL)
SHEATH ACCESS URETERAL 24CM (SHEATH) ×2 IMPLANT
SHEATH URET ACCESS 12FR/35CM (UROLOGICAL SUPPLIES) IMPLANT
SHEATH URET ACCESS 12FR/55CM (UROLOGICAL SUPPLIES) IMPLANT
STENT URET 6FRX24 CONTOUR (STENTS) ×2 IMPLANT

## 2012-11-21 NOTE — H&P (Signed)
History of Present Illness  Jeffery Frye returns for follow-up.  He has not passed a stone.  He has not had any pain.  He voids well.  KUB shows the calcification in the left distal ureter has not changed in position.   Past Medical History Problems  1. History of  Heartburn 787.1  Surgical History Problems  1. History of  Fiberoptic Examinations Esophagoscopy  Current Meds 1. Aspirin 81 MG Oral Tablet Chewable; Therapy: (Recorded:15Jul2009) to 2. Cialis 20 MG Oral Tablet; TAKE 1 TABLET AFTER A MEAL AS NEEDED; Therapy: 16Dec2013 to  (Evaluate:10Mar2014)  Requested for: 16Dec2013; Last Rx:16Dec2013 3. Omeprazole 20 MG Oral Capsule Delayed Release; Therapy: (Recorded:27Jun2014) to  Allergies Medication  1. No Known Drug Allergies  Family History Problems  1. Family history of  Family Health Status - Mother's Age 80yrs 2. Family history of  Family Health Status Number Of Children 1 daughter 3. Maternal history of  Nephrolithiasis  Social History Problems  1. Alcohol Use 1 QD 2. Caffeine Use 5c. QD 3. Death In The Family Father 74yrs, CHF 4. Marital History - Currently Married 5. Occupation: Naval architect 6. Tobacco Use V15.82 1 1/2 PPD since he was 57yrs old, quit 4 weeks ago (11/2011)  Review of Systems Genitourinary, constitutional, skin, eye, otolaryngeal, hematologic/lymphatic, cardiovascular, pulmonary, endocrine, musculoskeletal, gastrointestinal, neurological and psychiatric system(s) were reviewed and pertinent findings if present are noted.    Vitals Vital Signs [Data Includes: Last 1 Day]  08Oct2014 11:03AM  Blood Pressure: 122 / 76 Heart Rate: 93 Respiration: 18  Physical Exam Constitutional: Well nourished and well developed . No acute distress.  ENT:. The ears and nose are normal in appearance.  Neck: The appearance of the neck is normal and no neck mass is present.  Pulmonary: No respiratory distress and normal respiratory rhythm and effort.   Cardiovascular: Heart rate and rhythm are normal . No peripheral edema.  Abdomen: The abdomen is soft and nontender. No masses are palpated. No CVA tenderness. No hernias are palpable. No hepatosplenomegaly noted.  Genitourinary: Examination of the penis demonstrates no discharge, no masses, no lesions and a normal meatus. The scrotum is without lesions. The right epididymis is palpably normal and non-tender. The left epididymis is palpably normal and non-tender. The right testis is non-tender and without masses. The left testis is non-tender and without masses.  Lymphatics: The femoral and inguinal nodes are not enlarged or tender.  Skin: Normal skin turgor, no visible rash and no visible skin lesions.  Neuro/Psych:. Mood and affect are appropriate.    Results/Data Urine [Data Includes: Last 1 Day]   08Oct2014  COLOR YELLOW   APPEARANCE CLEAR   SPECIFIC GRAVITY 1.030   pH 6.0   GLUCOSE NEG mg/dL  BILIRUBIN NEG   KETONE TRACE mg/dL  BLOOD NEG   PROTEIN NEG mg/dL  UROBILINOGEN 1 mg/dL  NITRITE NEG   LEUKOCYTE ESTERASE NEG     KUB INDICATION: Left distal ureteral calculus KUB shows normal bowel gas pattern.  Psoas shadows are normal.  There are no calcifications in either kidneys.  The calcification in the area of the left distal ureter has not changed in position. IMPRESSION: Left distal urteral calculus.   Assessment Assessed  1. Distal Ureteral Stone On The Left 592.1  Plan Distal Ureteral Stone On The Left (592.1)  1. KUB  Done: 08Oct2014 12:00AM   Since patient has not passed stone he needs stone manipulation.  The procedure, risks, benefits were discussed with the patient.  The  risks include but are not limited to hemorrhage, infection, urteral injury, inability to extract or find the stone.  He understands and wishes to proceed.

## 2012-11-21 NOTE — Anesthesia Postprocedure Evaluation (Signed)
  Anesthesia Post-op Note  Patient: Jeffery Frye  Procedure(s) Performed: Procedure(s) (LRB): CYSTOSCOPY WITH RETROGRADE PYELOGRAM, URETEROSCOPY AND STENT PLACEMENT (Left) HOLMIUM LASER APPLICATION (Left)  Patient Location: PACU  Anesthesia Type: General  Level of Consciousness: awake and alert   Airway and Oxygen Therapy: Patient Spontanous Breathing  Post-op Pain: mild  Post-op Assessment: Post-op Vital signs reviewed, Patient's Cardiovascular Status Stable, Respiratory Function Stable, Patent Airway and No signs of Nausea or vomiting  Last Vitals:  Filed Vitals:   11/21/12 1100  BP: 123/81  Pulse: 87  Temp:   Resp: 27    Post-op Vital Signs: stable   Complications: No apparent anesthesia complications

## 2012-11-21 NOTE — Telephone Encounter (Signed)
Referral done

## 2012-11-21 NOTE — Anesthesia Procedure Notes (Signed)
Procedure Name: LMA Insertion Date/Time: 11/21/2012 9:42 AM Performed by: Norva Pavlov Pre-anesthesia Checklist: Patient identified, Emergency Drugs available, Suction available and Patient being monitored Patient Re-evaluated:Patient Re-evaluated prior to inductionOxygen Delivery Method: Circle System Utilized Preoxygenation: Pre-oxygenation with 100% oxygen Intubation Type: IV induction Ventilation: Mask ventilation without difficulty LMA: LMA inserted LMA Size: 5.0 Number of attempts: 1 Airway Equipment and Method: bite block Placement Confirmation: positive ETCO2 Tube secured with: Tape Dental Injury: Teeth and Oropharynx as per pre-operative assessment

## 2012-11-21 NOTE — Op Note (Signed)
DAVINCI GLOTFELTY is a 57 y.o.   11/21/2012  General  Preop diagnosis: Left distal ureteral calculus  Postop diagnosis: Same plus urethral stricture  Procedure done: Cystoscopy, urethral dilation, left retrograde pyelogram, ureteroscopy with holmium laser of ureteral calculus, stone extraction, insertion of double-J stent  Surgeon: Wendie Simmer. Dezmin Kittelson  Anesthesia: General  Indication: Patient is a 57 years old male who had been complaining of left-sided abdominal pain for the past 4-6 weeks. CT scan showed a stone in the left distal ureter. Patient was treated with medical expulsive therapy. However he has not passed the stone. KUB showed the stone in the same location. He is scheduled today for cystoscopy and stone manipulation  Procedure: The patient was identified by his wrist band and proper timeout was taken.  Under general anesthesia he was prepped and draped and placed in the dorsolithotomy position. A panendoscope was inserted in the urethra. The anterior urethra is normal there is a stricture in the bulbous urethra and the cystoscope could not be passed in the bladder. It is not a tight stricture and the stricture measures about 2- 3 mm. A sensor wire was passed through the cystoscope, the stricture and in the bladder. The stricture was dilated with Hayman's dilators up to #22 [place]. The cystoscope was then inserted without difficulty in the bladder. The bladder mucosa is normal. There is no stone or tumor in the bladder. The ureteral orifices are in normal position and shape.  Left retrograde pyelogram:  A cone-tip catheter was passed through the cystoscope and the left ureteral orifice. 5 cc of contrast were then injected through the cone-tip catheter. There is a filling defect in the distal ureter consistent with the known ureteral stone. The ureter proximal to the stone is moderately dilated. The cone-tip catheter was removed.  A sensor wire was passed through the cystoscope and the  left ureter up to the renal pelvis. The cystoscope was removed. The intramural ureter was dilated with the ureteroscope access sheath. A semirigid ureteroscope was then passed in the bladder and the left ureter. The stone was identified in the distal ureter. With a 365 microfiber holmium laser the stone was fragmented in multiple small fragments. The stone fragments were then removed with a 0 tip Nitinol basket. There was no evidence of remaining stone fragments in the ureter. The ureteroscope was advanced into the proximal ureter and there was no stone in the proximal ureter.  The ureteroscope was then withdrawn back to the left ureteral orifice. 10 cc of contrast were then injected through the ureteroscope. There is no evidence of extravasation of contrast. There is no filling defect in the ureter. The ureteroscope was then removed.  The sensor wire was then backloaded into the cystoscope and #6 [id number] double-J stent was passed over the sensor wire. The proximal curl of the double-J stent is in the renal pelvis. The distal curl is in the bladder. A string was left attached to the double-J stent. The bladder was then emptied and the cystoscope removed.  Patient tolerated the procedure well and left the OR in satisfactory condition to postanesthesia care unit.

## 2012-11-21 NOTE — Transfer of Care (Signed)
Immediate Anesthesia Transfer of Care Note  Patient: Jeffery Frye  Procedure(s) Performed: Procedure(s) (LRB): CYSTOSCOPY WITH RETROGRADE PYELOGRAM, URETEROSCOPY AND STENT PLACEMENT (Left) HOLMIUM LASER APPLICATION (Left)  Patient Location: PACU  Anesthesia Type: General  Level of Consciousness: awake, alert  and oriented  Airway & Oxygen Therapy: Patient Spontanous Breathing and Patient connected to face mask oxygen  Post-op Assessment: Report given to PACU RN and Post -op Vital signs reviewed and stable  Post vital signs: Reviewed and stable  Complications: No apparent anesthesia complications

## 2012-11-21 NOTE — Discharge Instructions (Signed)
Post Anesthesia Home Care Instructions  Activity: Get plenty of rest for the remainder of the day. A responsible adult should stay with you for 24 hours following the procedure.  For the next 24 hours, DO NOT: -Drive a car -Operate machinery -Drink alcoholic beverages -Take any medication unless instructed by your physician -Make any legal decisions or sign important papers.  Meals: Start with liquid foods such as gelatin or soup. Progress to regular foods as tolerated. Avoid greasy, spicy, heavy foods. If nausea and/or vomiting occur, drink only clear liquids until the nausea and/or vomiting subsides. Call your physician if vomiting continues.  Special Instructions/Symptoms: Your throat may feel dry or sore from the anesthesia or the breathing tube placed in your throat during surgery. If this causes discomfort, gargle with warm salt water. The discomfort should disappear within 24 hours.      [facility] [phone] Post Ureteroscopy With or Without Stent Instructions  Definitions:  Ureter: The duct that transports urine from the kidney to the bladder. Stent:   A plastic hollow tube that is placed into the ureter, from the kidney to the bladder to prevent the ureter from swelling shut.  GENERAL INSTRUCTIONS:  Despite the fact that no skin incisions were used, the area around the ureter and bladder is raw and irritated. The stent is a foreign body which will further irritate the bladder wall. This irritation is manifested by increased frequency of urination, both day and night, and by an increase in the urge to urinate. In some, the urge to urinate is present almost always. Sometimes the urge is strong enough that you may not be able to stop yourself from urinating. The only real cure is to remove the stent and then give time for the bladder wall to heal which can't be done until the danger of the ureter swelling shut has passed, which varies.  You may see some  blood in your urine while the stent is in place and a few days afterwards. Do not be alarmed, even if the urine was clear for a while. Get off your feet and drink lots of fluids until clearing occurs. If you start to pass clots or don't improve, call us.  DIET: You may return to your normal diet immediately. Because of the raw surface of your bladder, alcohol, spicy foods, acid type foods and drinks with caffeine may cause irritation or frequency and should be used in moderation. To keep your urine flowing freely and to avoid constipation, drink plenty of fluids during the day ( 8-10 glasses ). Tip: Avoid cranberry juice because it is very acidic.  ACTIVITY: Your physical activity doesn't need to be restricted. However, if you are very active, you may see some blood in your urine. We suggest that you reduce your activity under these circumstances until the bleeding has stopped.  BOWELS: It is important to keep your bowels regular during the postoperative period. Straining with bowel movements can cause bleeding. A bowel movement every other day is reasonable. Use a mild laxative if needed, such as Milk of Magnesia 2-3 tablespoons, or 2 Dulcolax tablets. Call if you continue to have problems. If you have been taking narcotics for pain, before, during or after your surgery, you may be constipated. Take a laxative if necessary.   MEDICATION: You should resume your pre-surgery medications unless told not to. In addition you will often be given an antibiotic to prevent infection. These should be taken as prescribed until the bottles are finished unless   you are having an unusual reaction to one of the drugs.  PROBLEMS YOU SHOULD REPORT TO US: Fevers over 100.5 Fahrenheit. Heavy bleeding, or clots ( See above notes about blood in urine ). Inability to urinate. Drug reactions ( hives, rash, nausea, vomiting, diarrhea ). Severe burning or pain with urination that is not improving.  FOLLOW-UP: You will  need a follow-up appointment to monitor your progress. Call for this appointment at the number listed above. Usually the first appointment will be about three to fourteen days after your surgery.      

## 2012-11-21 NOTE — Anesthesia Preprocedure Evaluation (Addendum)
Anesthesia Evaluation  Patient identified by MRN, date of birth, ID band Patient awake    Reviewed: Allergy & Precautions, H&P , NPO status , Patient's Chart, lab work & pertinent test results, reviewed documented beta blocker date and time   Airway Mallampati: II TM Distance: >3 FB Neck ROM: full    Dental  (+) Edentulous Upper, Missing and Dental Advisory Given Has only 4 lower teeth left:   Pulmonary Current Smoker,  breath sounds clear to auscultation  Pulmonary exam normal       Cardiovascular Exercise Tolerance: Good + CAD Rhythm:regular Rate:Normal  Non-ischemic cardiomyopathy.  No CP   Neuro/Psych Carotid artery disease negative psych ROS   GI/Hepatic negative GI ROS, hiatal hernia, GERD-  Controlled,(+) Hepatitis -, UnspecifiedBarrett's esophagus   Endo/Other  negative endocrine ROS  Renal/GU negative Renal ROS  negative genitourinary   Musculoskeletal   Abdominal   Peds  Hematology negative hematology ROS (+)   Anesthesia Other Findings   Reproductive/Obstetrics negative OB ROS                          Anesthesia Physical Anesthesia Plan  ASA: III  Anesthesia Plan: General   Post-op Pain Management:    Induction: Intravenous  Airway Management Planned: LMA  Additional Equipment:   Intra-op Plan:   Post-operative Plan:   Informed Consent: I have reviewed the patients History and Physical, chart, labs and discussed the procedure including the risks, benefits and alternatives for the proposed anesthesia with the patient or authorized representative who has indicated his/her understanding and acceptance.   Dental Advisory Given  Plan Discussed with: CRNA and Surgeon  Anesthesia Plan Comments:         Anesthesia Quick Evaluation

## 2012-11-21 NOTE — Telephone Encounter (Signed)
Please refer to pulmonary

## 2012-11-27 ENCOUNTER — Encounter (HOSPITAL_BASED_OUTPATIENT_CLINIC_OR_DEPARTMENT_OTHER): Payer: Self-pay | Admitting: Urology

## 2013-01-02 ENCOUNTER — Institutional Professional Consult (permissible substitution): Payer: 59 | Admitting: Pulmonary Disease

## 2013-02-09 ENCOUNTER — Ambulatory Visit (INDEPENDENT_AMBULATORY_CARE_PROVIDER_SITE_OTHER): Payer: 59 | Admitting: Pulmonary Disease

## 2013-02-09 ENCOUNTER — Encounter: Payer: Self-pay | Admitting: Pulmonary Disease

## 2013-02-09 VITALS — BP 122/80 | HR 100 | Temp 97.9°F | Ht 69.0 in | Wt 227.4 lb

## 2013-02-09 DIAGNOSIS — G4733 Obstructive sleep apnea (adult) (pediatric): Secondary | ICD-10-CM

## 2013-02-09 NOTE — Patient Instructions (Signed)
Will schedule for sleep study, and will arrange for followup once the results are available.  Work on weight loss.

## 2013-02-09 NOTE — Progress Notes (Signed)
Subjective:    Patient ID: Jeffery Frye, male    DOB: 11/01/1955, 58 y.o.   MRN: [id number]  HPI The patient is a 58 year old male who I've been asked to see for possible obstructive sleep apnea. He has been noted to have loud snoring, and was recently hospitalized for kidney stones with witnessed apneas during that time. His bed partner has complained about his snoring, but has never mentioned to him an abnormal breathing pattern during sleep. The patient has intermittent frequent awakenings at night, sometimes associated with difficulty returning to sleep. He is unrested at least 50% the mornings, and does note sleep pressure in the afternoons with inactivity. He has dosing in the evening at times with television, but denies any sleepiness with driving. His weight is up 30-40 pounds over the last 2 years, and his Epworth score today is 9.   Sleep Questionnaire What time do you typically go to bed?( Between what hours) 8-9pm 8-9pm at 1115 on 02/09/13 by Maisie FusAshtyn M Green, CMA How long does it take you to fall asleep? 9130min-1hr 9630min-1hr at 1115 on 02/09/13 by Maisie FusAshtyn M Green, CMA How many times during the night do you wake up? 1 1 at 1115 on 02/09/13 by Maisie FusAshtyn M Green, CMA What time do you get out of bed to start your day? 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 (In pounds) 40 lb (18.144 kg) 40 lb (18.144 kg) at 1115 on 02/09/13 by Maisie FusAshtyn M Green, CMA Have you ever had a sleep study before? No No at 1115 on 02/09/13 by Maisie FusAshtyn M Green, CMA Do you currently use CPAP? No No at 1115 on 02/09/13 by Maisie FusAshtyn M Green, CMA Do you wear oxygen at any time? No No at 1115 on 02/09/13 by Maisie FusAshtyn M Green, CMA   Review of Systems  Constitutional: Negative for fever and  unexpected weight change.  HENT: Negative for congestion, dental problem, ear pain, nosebleeds, postnasal drip, rhinorrhea, sinus pressure, sneezing, sore throat and trouble swallowing.   Eyes: Negative for redness and itching.  Respiratory: Positive for cough. Negative for chest tightness, shortness of breath and wheezing.   Cardiovascular: Negative for palpitations and leg swelling.  Gastrointestinal: Negative for nausea and vomiting.       Acid heartburn  Genitourinary: Negative for dysuria.  Musculoskeletal: Negative for joint swelling.  Skin: Negative for rash.  Neurological: Negative for headaches.  Hematological: Does not bruise/bleed easily.  Psychiatric/Behavioral: Negative for dysphoric mood. The patient is not nervous/anxious.        Objective:   Physical Exam Constitutional:  Overweight male, no acute distress  HENT:  Nares patent without discharge, mild crusting noted.  Oropharynx without exudate, palate normal, and uvula elongated.   Eyes:  Perrla, eomi, no scleral icterus  Neck:  No JVD, no TMG  Cardiovascular:  Normal rate, regular rhythm, no rubs or gallops.  No murmurs        Intact distal pulses  Pulmonary :  Normal breath sounds, no stridor or respiratory distress   No rales, rhonchi, or wheezing  Abdominal:  Soft, nondistended, bowel sounds present.  No tenderness noted.   Musculoskeletal:  No lower extremity edema noted.  Lymph Nodes:  No cervical lymphadenopathy noted  Skin:  No cyanosis  noted  Neurologic:  Alert, appropriate, moves all 4 extremities without obvious deficit.         Assessment & Plan:

## 2013-02-09 NOTE — Assessment & Plan Note (Signed)
Patient's history is suspicious for clinically significant sleep apnea. I have had a long discussion with him about sleep apnea, including its impact to his quality of life and cardiovascular health. I think there is enough suspicion to proceed with a sleep study, and would do so at the sleep Center given his awakenings at night with an issue getting back to sleep. Will see him back once the results are available.

## 2013-03-09 ENCOUNTER — Ambulatory Visit (HOSPITAL_BASED_OUTPATIENT_CLINIC_OR_DEPARTMENT_OTHER): Payer: 59 | Attending: Pulmonary Disease

## 2013-03-09 VITALS — Ht 69.0 in | Wt 227.0 lb

## 2013-03-09 DIAGNOSIS — G4733 Obstructive sleep apnea (adult) (pediatric): Secondary | ICD-10-CM

## 2013-03-15 ENCOUNTER — Telehealth: Payer: Self-pay | Admitting: Pulmonary Disease

## 2013-03-15 DIAGNOSIS — G4733 Obstructive sleep apnea (adult) (pediatric): Secondary | ICD-10-CM

## 2013-03-15 NOTE — Telephone Encounter (Signed)
Needs ov to review sleep study 

## 2013-03-15 NOTE — Sleep Study (Signed)
   NAME: Jeffery MeiersWilliam M Frye DATE OF BIRTH:  Jun 09, 1955 MEDICAL RECORD NUMBER [id number]  LOCATION: [facility]  PHYSICIAN: Barbaraann ShareCLANCE,Antrice Pal M  DATE OF STUDY: 03/09/2013  SLEEP STUDY TYPE: Nocturnal Polysomnogram               REFERRING PHYSICIAN: Mirza Fessel, Maree KrabbeKeith M, MD  INDICATION FOR STUDY: Hypersomnia with sleep apnea  EPWORTH SLEEPINESS SCORE:  12 HEIGHT: 5\' 9"  (175.3 cm)  WEIGHT: 227 lb (102.967 kg)    Body mass index is 33.51 kg/(m^2).  NECK SIZE: 15 in.  MEDICATIONS: Reviewed in sleep chart  SLEEP ARCHITECTURE: The patient had a total sleep time of 213 minutes with very little slow-wave sleep and only 33 minutes of REM. Sleep onset latency was mildly prolonged at 31 minutes, and REM onset was normal at 72 minutes. Sleep efficiency was poor at 59%.  RESPIRATORY DATA: The patient was noted to have 42 apneas and 24 obstructive hypopneas, giving him an AHI of 19 events per hour. The events occurred all in the supine position, and were also increased during REM. Loud snoring was noted throughout.  OXYGEN DATA: There was oxygen desaturation as low as 68% with the patient's obstructive events  CARDIAC DATA: No clinically significant arrhythmias were noted.  MOVEMENT/PARASOMNIA: No significant limb movements or other abnormal behaviors were seen.  IMPRESSION/ RECOMMENDATION:    1)   Moderate obstructive sleep apnea/hypopnea syndrome, with an AHI of 19 events per hour and oxygen desaturation as low as 68%. Treatment for this degree of sleep apnea can include a trial of weight loss alone, upper airway surgery, dental appliance, and also CPAP. Clinical correlation is suggested.     Barbaraann ShareLANCE,Amandine Covino M Diplomate, American Board of Sleep Medicine  ELECTRONICALLY SIGNED ON:  03/15/2013, 3:42 PM [facility] PH: [phone]   FX: [phone] ACCREDITED BY THE AMERICAN ACADEMY OF SLEEP MEDICINE

## 2013-03-19 NOTE — Telephone Encounter (Signed)
PT SCHEDULED FOR OV WITH KC TO REVIEW SLEEP STUDY 03/21/13 AT 130

## 2013-03-21 ENCOUNTER — Ambulatory Visit (INDEPENDENT_AMBULATORY_CARE_PROVIDER_SITE_OTHER): Payer: 59 | Admitting: Pulmonary Disease

## 2013-03-21 ENCOUNTER — Encounter: Payer: Self-pay | Admitting: Pulmonary Disease

## 2013-03-21 VITALS — BP 118/86 | HR 98 | Temp 97.7°F | Ht 69.0 in | Wt 231.4 lb

## 2013-03-21 DIAGNOSIS — G4733 Obstructive sleep apnea (adult) (pediatric): Secondary | ICD-10-CM

## 2013-03-21 NOTE — Assessment & Plan Note (Signed)
The patient has been diagnosed with moderate OSA by his recent sleep study, and he is clearly symptomatic both during the day and night. I have reviewed with him the various treatment options, including a trial of weight loss alone, dental appliance, and also CPAP. After a long discussion, we have both agreed to give CPAP a try. I will set the patient up on cpap at a moderate pressure level to allow for desensitization, and will troubleshoot the device over the next 4-6weeks if needed.  The pt is to call me if having issues with tolerance.  Will then optimize the pressure once patient is able to wear cpap on a consistent basis.

## 2013-03-21 NOTE — Progress Notes (Signed)
   Subjective:    Patient ID: Jeffery Frye, male    DOB: May 01, 1955, 58 y.o.   MRN: [id number]  HPI The patient comes in today for followup of his recent sleep study. He was found to have moderate OSA, with an AHI of 19 events per hour and oxygen desaturation as low as 68%. I have reviewed the study with him in detail, and answered all of his questions.   Review of Systems  Constitutional: Negative for fever and unexpected weight change.  HENT: Negative for congestion, dental problem, ear pain, nosebleeds, postnasal drip, rhinorrhea, sinus pressure, sneezing, sore throat and trouble swallowing.   Eyes: Negative for redness and itching.  Respiratory: Negative for cough, chest tightness, shortness of breath and wheezing.   Cardiovascular: Negative for palpitations and leg swelling.  Gastrointestinal: Negative for nausea and vomiting.  Genitourinary: Negative for dysuria.  Musculoskeletal: Negative for joint swelling.  Skin: Negative for rash.  Neurological: Negative for headaches.  Hematological: Does not bruise/bleed easily.  Psychiatric/Behavioral: Negative for dysphoric mood. The patient is not nervous/anxious.        Objective:   Physical Exam Overweight male in no acute distress Nose without purulence or discharge noted Neck without lymphadenopathy or thyromegaly Lower extremities without edema, no cyanosis Alert and oriented, moves all 4 extremities.       Assessment & Plan:

## 2013-03-21 NOTE — Patient Instructions (Signed)
Will send you to a medical equipment company to see about getting set up on cpap.  You can use a friend's old machine or get your own.  They can give you some idea of cost.   Work on weight loss followup with me in 8 weeks, but call if having issues with your machine.

## 2013-05-16 ENCOUNTER — Encounter: Payer: Self-pay | Admitting: Pulmonary Disease

## 2013-05-16 ENCOUNTER — Ambulatory Visit (INDEPENDENT_AMBULATORY_CARE_PROVIDER_SITE_OTHER): Payer: 59 | Admitting: Pulmonary Disease

## 2013-05-16 VITALS — BP 118/82 | HR 93 | Temp 97.9°F | Ht 69.0 in | Wt 228.8 lb

## 2013-05-16 DIAGNOSIS — G4733 Obstructive sleep apnea (adult) (pediatric): Secondary | ICD-10-CM

## 2013-05-16 NOTE — Assessment & Plan Note (Signed)
The patient has seen improvement in his sleep when he wears CPAP, but has had issues with his air conditioning which has made his house quite hot and kept him from using the device consistently. Now that his air conditioning is 6, I have asked him to make wearing his CPAP a priority for him. We'll also change his setting to the automatic mode. Finally, I have asked him to keep up with his mask changes and supplies, and to work aggressively on weight loss.

## 2013-05-16 NOTE — Patient Instructions (Signed)
Will set your machine on the auto setting, so it can self adjust. Try and wear cpap as much as possible.  Make it a priority every night now that your airconditioning is working properly Work on weight loss followup with me again in 6mos, but call if having issues with tolerance.

## 2013-05-16 NOTE — Progress Notes (Signed)
   Subjective:    Patient ID: Jeffery Frye, male    DOB: November 01, 1955, 58 y.o.   MRN: [id number]  HPI The patient comes in today for followup of his obstructive sleep apnea. He initially wore CPAP compliantly, but then had issues with his air conditioning which led to over heating and an inability to tolerate the mask during the night. He has had no issues with his mask fit or pressure, and states that he sleeps better when he does wear the mask more consistently.   Review of Systems  Constitutional: Negative for fever and unexpected weight change.  HENT: Negative for congestion, dental problem, ear pain, nosebleeds, postnasal drip, rhinorrhea, sinus pressure, sneezing, sore throat and trouble swallowing.   Eyes: Negative for redness and itching.  Respiratory: Negative for cough, chest tightness, shortness of breath and wheezing.   Cardiovascular: Negative for palpitations and leg swelling.  Gastrointestinal: Negative for nausea and vomiting.  Genitourinary: Negative for dysuria.  Musculoskeletal: Negative for joint swelling.  Skin: Negative for rash.  Neurological: Negative for headaches.  Hematological: Does not bruise/bleed easily.  Psychiatric/Behavioral: Negative for dysphoric mood. The patient is not nervous/anxious.        Objective:   Physical Exam Overweight male in no acute distress Nose without purulence or discharge noted No skin breakdown or pressure necrosis from the CPAP mask Neck without lymphadenopathy or thyromegaly Lower extremities without edema, no cyanosis Alert and oriented, moves all 4 extremities.       Assessment & Plan:

## 2013-07-11 ENCOUNTER — Encounter: Payer: Self-pay | Admitting: Internal Medicine

## 2013-09-20 ENCOUNTER — Encounter: Payer: Self-pay | Admitting: Internal Medicine

## 2013-11-14 ENCOUNTER — Ambulatory Visit: Payer: 59 | Admitting: Pulmonary Disease

## 2014-01-26 ENCOUNTER — Encounter (HOSPITAL_COMMUNITY): Payer: Self-pay | Admitting: *Deleted

## 2014-01-26 ENCOUNTER — Emergency Department (HOSPITAL_COMMUNITY)
Admission: EM | Admit: 2014-01-26 | Discharge: 2014-01-26 | Disposition: A | Discharge status: Home / Self Care | Payer: 59 | Source: Home / Self Care | Attending: Emergency Medicine | Admitting: Emergency Medicine

## 2014-01-26 DIAGNOSIS — J40 Bronchitis, not specified as acute or chronic: Secondary | ICD-10-CM

## 2014-01-26 MED ORDER — PREDNISONE 10 MG PO TABS: ORAL_TABLET | ORAL | Status: DC

## 2014-01-26 MED ORDER — ALBUTEROL SULFATE HFA 108 (90 BASE) MCG/ACT IN AERS: 1.0000 | INHALATION_SPRAY | Freq: Four times a day (QID) | RESPIRATORY_TRACT | Status: DC | PRN

## 2014-01-26 NOTE — ED Notes (Signed)
Pt   Reports  Symptoms  Of cough  /  Congestion    Drainage  X  3  Weeks          Symptoms  Not  releived  By  OTC  meds      Pt  Reports  Some  Nausea  No vomiting           Speaking in  Complete  sentances  And  Is awake  And  Alert

## 2014-01-26 NOTE — Discharge Instructions (Signed)
Upper Respiratory Infection, Adult An upper respiratory infection (URI) is also sometimes known as the common cold. The upper respiratory tract includes the nose, sinuses, throat, trachea, and bronchi. Bronchi are the airways leading to the lungs. Most people improve within 1 week, but symptoms can last up to 2 weeks. A residual cough may last even longer.  CAUSES Many different viruses can infect the tissues lining the upper respiratory tract. The tissues become irritated and inflamed and often become very moist. Mucus production is also common. A cold is contagious. You can easily spread the virus to others by oral contact. This includes kissing, sharing a glass, coughing, or sneezing. Touching your mouth or nose and then touching a surface, which is then touched by another person, can also spread the virus. SYMPTOMS  Symptoms typically develop 1 to 3 days after you come in contact with a cold virus. Symptoms vary from person to person. They may include:  Runny nose.  Sneezing.  Nasal congestion.  Sinus irritation.  Sore throat.  Loss of voice (laryngitis).  Cough.  Fatigue.  Muscle aches.  Loss of appetite.  Headache.  Low-grade fever. DIAGNOSIS  You might diagnose your own cold based on familiar symptoms, since most people get a cold 2 to 3 times a year. Your caregiver can confirm this based on your exam. Most importantly, your caregiver can check that your symptoms are not due to another disease such as strep throat, sinusitis, pneumonia, asthma, or epiglottitis. Blood tests, throat tests, and X-rays are not necessary to diagnose a common cold, but they may sometimes be helpful in excluding other more serious diseases. Your caregiver will decide if any further tests are required. RISKS AND COMPLICATIONS  You may be at risk for a more severe case of the common cold if you smoke cigarettes, have chronic heart disease (such as heart failure) or lung disease (such as asthma), or if  you have a weakened immune system. The very young and very old are also at risk for more serious infections. Bacterial sinusitis, middle ear infections, and bacterial pneumonia can complicate the common cold. The common cold can worsen asthma and chronic obstructive pulmonary disease (COPD). Sometimes, these complications can require emergency medical care and may be life-threatening. PREVENTION  The best way to protect against getting a cold is to practice good hygiene. Avoid oral or hand contact with people with cold symptoms. Wash your hands often if contact occurs. There is no clear evidence that vitamin C, vitamin E, echinacea, or exercise reduces the chance of developing a cold. However, it is always recommended to get plenty of rest and practice good nutrition. TREATMENT  Treatment is directed at relieving symptoms. There is no cure. Antibiotics are not effective, because the infection is caused by a virus, not by bacteria. Treatment may include:  Increased fluid intake. Sports drinks offer valuable electrolytes, sugars, and fluids.  Breathing heated mist or steam (vaporizer or shower).  Eating chicken soup or other clear broths, and maintaining good nutrition.  Getting plenty of rest.  Using gargles or lozenges for comfort.  Controlling fevers with ibuprofen or acetaminophen as directed by your caregiver.  Increasing usage of your inhaler if you have asthma. Zinc gel and zinc lozenges, taken in the first 24 hours of the common cold, can shorten the duration and lessen the severity of symptoms. Pain medicines may help with fever, muscle aches, and throat pain. A variety of non-prescription medicines are available to treat congestion and runny nose. Your caregiver   can make recommendations and may suggest nasal or lung inhalers for other symptoms.  HOME CARE INSTRUCTIONS   Only take over-the-counter or prescription medicines for pain, discomfort, or fever as directed by your  caregiver.  Use a warm mist humidifier or inhale steam from a shower to increase air moisture. This may keep secretions moist and make it easier to breathe.  Drink enough water and fluids to keep your urine clear or pale yellow.  Rest as needed.  Return to work when your temperature has returned to normal or as your caregiver advises. You may need to stay home longer to avoid infecting others. You can also use a face mask and careful hand washing to prevent spread of the virus. SEEK MEDICAL CARE IF:   After the first few days, you feel you are getting worse rather than better.  You need your caregiver's advice about medicines to control symptoms.  You develop chills, worsening shortness of breath, or brown or red sputum. These may be signs of pneumonia.  You develop yellow or brown nasal discharge or pain in the face, especially when you bend forward. These may be signs of sinusitis.  You develop a fever, swollen neck glands, pain with swallowing, or white areas in the back of your throat. These may be signs of strep throat. SEEK IMMEDIATE MEDICAL CARE IF:   You have a fever.  You develop severe or persistent headache, ear pain, sinus pain, or chest pain.  You develop wheezing, a prolonged cough, cough up blood, or have a change in your usual mucus (if you have chronic lung disease).  You develop sore muscles or a stiff neck. Document Released: 07/14/2000 Document Revised: 04/12/2011 Document Reviewed: 04/25/2013 [employer] Patient Information 2015 [employer]. This information is not intended to replace advice given to you by your health care provider. Make sure you discuss any questions you have with your health care provider.  

## 2014-01-26 NOTE — ED Provider Notes (Signed)
CSN: [id number]     Arrival date & time 01/26/14  1120 History   First MD Initiated Contact with Patient 01/26/14 1222     Chief Complaint  Patient presents with  . URI   (Consider location/radiation/quality/duration/timing/severity/associated sxs/prior Treatment) HPI Comments: Reports symptoms began as a typical common cold but has had difficulty with lingering cough. Denies fever, chills, dyspnea, pedal edema, chest pain, night sweats or hemoptysis. Is a smoker. Limited improvement at home with Sudafed and Mucinex. Works as Insurance claims handlersanitation worker for [employer] . PCP: Dr. Excell SeltzerJ. John  Patient is a 58 y.o. male presenting with URI. The history is provided by the patient.  URI Presenting symptoms: congestion and cough   Presenting symptoms: no fever   Severity:  Mild Onset quality:  Gradual Duration:  3 weeks Progression:  Improving Chronicity:  New Associated symptoms: wheezing     Past Medical History  Diagnosis Date  . Hyperlipidemia   . Arthritis   . Barrett esophagus   . BPH (benign prostatic hypertrophy)   . Nonischemic cardiomyopathy PER PT ON 11-17-2012 STATED HAS TAKEN HIS COREG FOR A YEAR     LAST LOV TO CARDIOLOGIST  2010-  PCP DR Oliver BarreJAMES JOHN   . GERD (gastroesophageal reflux disease)   . Left ureteral calculus   . History of prostatitis     HX RECURRENT  . Stenosis of both internal carotid arteries     MILD--  0-39%  PER DUPLEX OF 2011  . H/O hiatal hernia   . Frequency of urination   . Urgency of urination    Past Surgical History  Procedure Laterality Date  . Inguinal hernia repair Right 1997  . Cardiac catheterization  05-15-2008    MILD GLOBAL HYPOKINESIS, WORSE INFERIORLY/  MILD DILATED LVSF/  EF 40-45%  . Cardiovascular stress test  04-03-2008    LOW RISK NUCLEAR STUDY/ EF 44%/ MILD HYPOKINESIS/ NO ISCHEMIA/  FIXED INFERIOR DEFECT (COULD BE ATTENUATION)  . Transthoracic echocardiogram  04-03-2008    EF 45-50%/  MILD DIFFUSE LV HYPOKINESIS/  NO VALVE  ABNORMALITIES  . Cystoscopy with retrograde pyelogram, ureteroscopy and stent placement Left 11/21/2012    Procedure: CYSTOSCOPY WITH RETROGRADE PYELOGRAM, URETEROSCOPY AND STENT PLACEMENT;  Surgeon: Lindaann SloughMarc-Henry Nesi, MD;  [facility];  Service: Urology;  Laterality: Left;  . Holmium laser application Left 11/21/2012    Procedure: HOLMIUM LASER APPLICATION;  Surgeon: Lindaann SloughMarc-Henry Nesi, MD;  [facility];  Service: Urology;  Laterality: Left;   Family History  Problem Relation Age of Onset  . Colon polyps Neg Hx   . Colon cancer Neg Hx   . Rectal cancer Neg Hx   . Stomach cancer Neg Hx   . Heart disease Father     deceased-- CHF   History  Substance Use Topics  . Smoking status: Current Every Day Smoker -- 1.00 packs/day for 42 years    Types: Cigarettes  . Smokeless tobacco: Never Used  . Alcohol Use: 7.2 oz/week    12 Cans of beer per week    Review of Systems  Constitutional: Negative for fever and chills.  HENT: Positive for congestion and postnasal drip.   Eyes: Negative.   Respiratory: Positive for cough and wheezing. Negative for chest tightness and shortness of breath.   Cardiovascular: Negative.   Gastrointestinal: Negative.   Musculoskeletal: Negative for back pain.    Allergies  Review of patient's allergies indicates no known allergies.  Home Medications   Prior to  Admission medications   Medication Sig Start Date End Date Taking? Authorizing Provider  albuterol (PROVENTIL HFA;VENTOLIN HFA) 108 (90 BASE) MCG/ACT inhaler Inhale 1-2 puffs into the lungs every 6 (six) hours as needed for wheezing.    Domenick GongAshley Mortenson, MD  albuterol (PROVENTIL HFA;VENTOLIN HFA) 108 (90 BASE) MCG/ACT inhaler Inhale 1-2 puffs into the lungs every 6 (six) hours as needed for wheezing or shortness of breath (or persistent cough). 01/26/14   Mathis FareJennifer Lee H Saulo Anthis, PA  aspirin 81 MG tablet Take 81 mg by mouth daily.      Historical Provider,  MD  ibuprofen (ADVIL,MOTRIN) 600 MG tablet Take 1 tablet (600 mg total) by mouth every 8 (eight) hours as needed for pain. 03/17/12   Adlih Moreno-Coll, MD  Omega-3 Fatty Acids (FISH OIL) 500 MG CAPS Take 1 capsule by mouth every other day.    Historical Provider, MD  omeprazole (PRILOSEC) 20 MG capsule Take 20 mg by mouth every evening.     Historical Provider, MD  polyethylene glycol powder (GLYCOLAX/MIRALAX) powder Take 17 g by mouth every other day. 08/28/12   Rodolph BongEvan S Corey, MD  predniSONE (DELTASONE) 10 MG tablet Take 5 po QD day 1, 4 po QD day 2, 3 po QD day 3, 2 po QD day 4 and 1 po QD day 5 then stop 01/26/14   Jess BartersJennifer Lee H Demar Shad, PA   BP 126/82 mmHg  Pulse 95  Temp(Src) 98.7 F (37.1 C) (Oral)  Resp 16  SpO2 95% Physical Exam  Constitutional: He is oriented to person, place, and time. He appears well-developed and well-nourished. No distress.  HENT:  Head: Normocephalic and atraumatic.  Right Ear: Hearing, tympanic membrane, external ear and ear canal normal.  Left Ear: Hearing, tympanic membrane, external ear and ear canal normal.  Nose: Nose normal.  Mouth/Throat: Uvula is midline, oropharynx is clear and moist and mucous membranes are normal.  Eyes: Conjunctivae are normal. No scleral icterus.  Cardiovascular: Normal rate, regular rhythm and normal heart sounds.   Pulmonary/Chest: Effort normal. No respiratory distress. He has no rales. He exhibits no tenderness.  +occasional end-expiratory wheezing  Musculoskeletal: Normal range of motion.  Neurological: He is alert and oriented to person, place, and time.  Skin: Skin is warm and dry.  Psychiatric: He has a normal mood and affect. His behavior is normal.  Nursing note and vitals reviewed.   ED Course  Procedures (including critical care time) Labs Review Labs Reviewed - No data to display  Imaging Review No results found.   MDM   1. Bronchitis     I suspect patient has mild acute exacerbation of chronic  bronchitis from recent URI. Will advise him to discontinue smoking and begin using prednisone taper and albuterol as directed with PCP follow up if no improvement.     Ria ClockJennifer Lee H Katonya Blecher, [place] 01/26/14 [phone]

## 2014-04-03 ENCOUNTER — Encounter: Payer: Self-pay | Admitting: Internal Medicine

## 2014-04-10 ENCOUNTER — Encounter: Payer: Self-pay | Admitting: Internal Medicine

## 2014-05-08 ENCOUNTER — Encounter: Payer: Self-pay | Admitting: Internal Medicine

## 2014-10-11 ENCOUNTER — Encounter: Payer: Self-pay | Admitting: Internal Medicine

## 2015-04-17 IMAGING — CT CT ABD-PELV W/O CM
2 of 4 series · 17 of 46 positions shown, 19 images · non-contrast
Comparison: Abdominal radiograph 08/28/2012

CLINICAL DATA: 6 days of left-sided flank pain, hematuria

CT ABDOMEN AND PELVIS WITHOUT CONTRAST
TECHNIQUE: Multidetector CT imaging of the abdomen and pelvis was
performed following the standard protocol without intravenous
contrast.

[Series 2: ap stone study · axial · 0.78mm/px · z∈[-461,-31]mm · 14 of 94 slices shown, 16 images]
[im 4/94  soft-tissue]
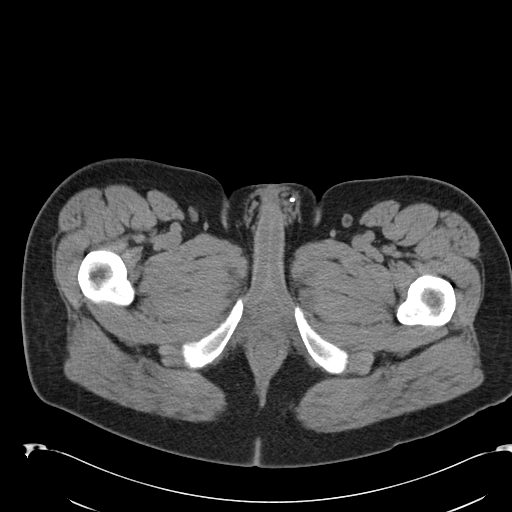
[im 4/94  bone]
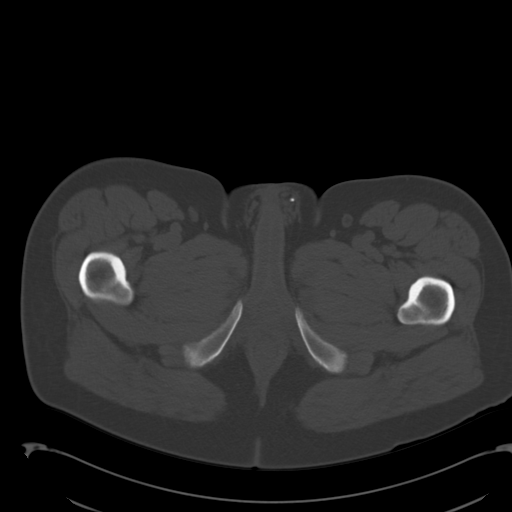
[im 12/94  soft-tissue]
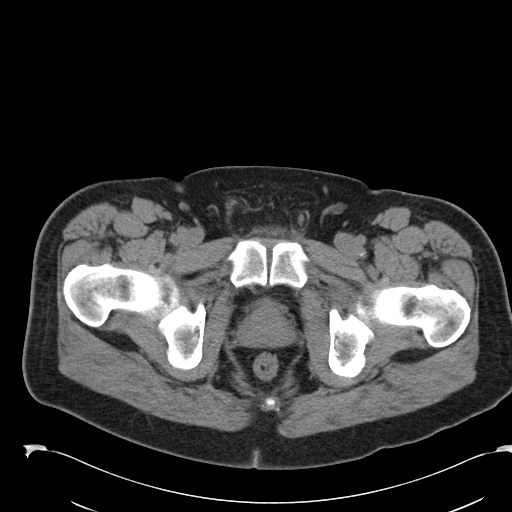
[im 19/94  soft-tissue]
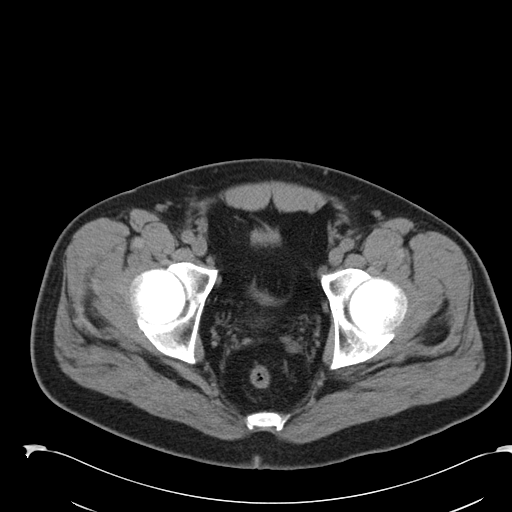
[im 27/94  soft-tissue]
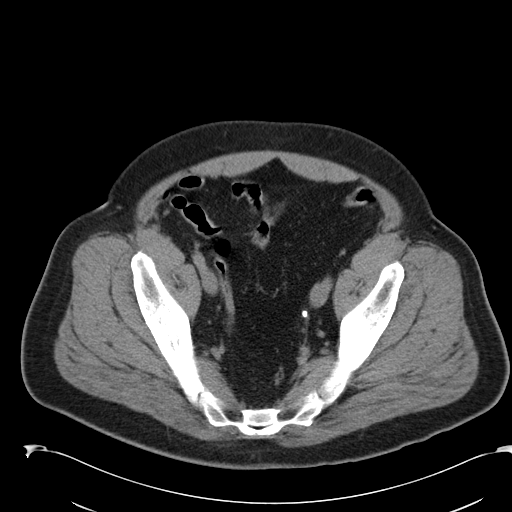
[im 30/94  soft-tissue]
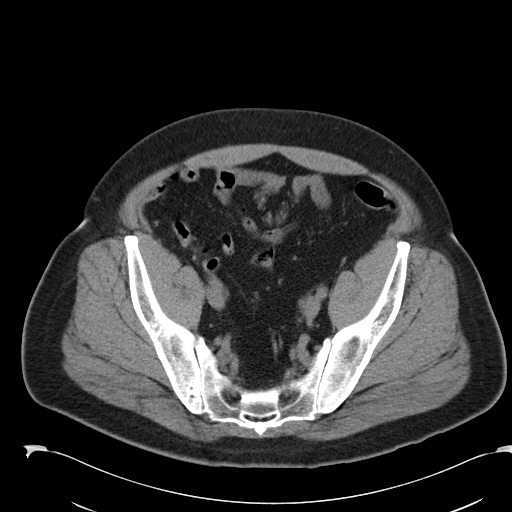
[im 38/94  soft-tissue]
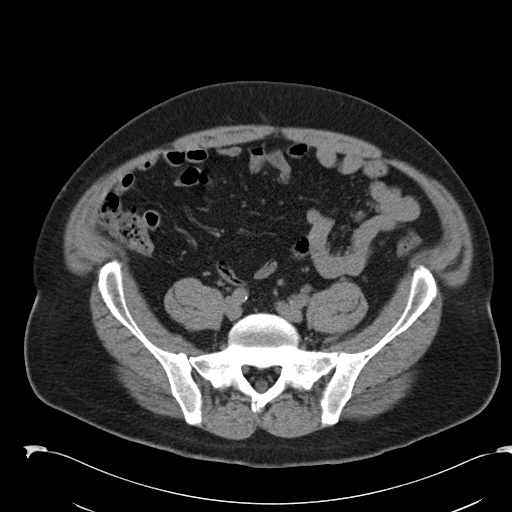
[im 45/94  soft-tissue]
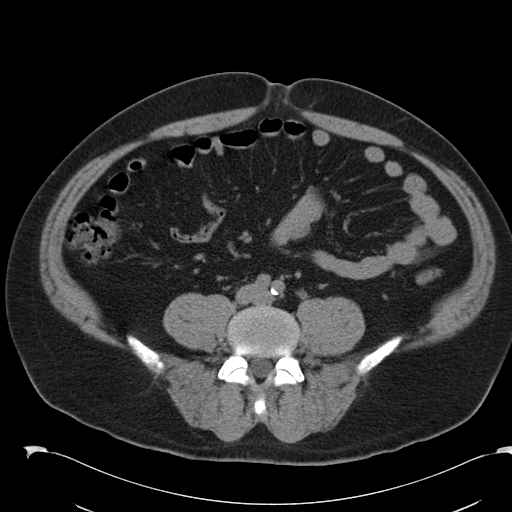
[im 49/94  soft-tissue]
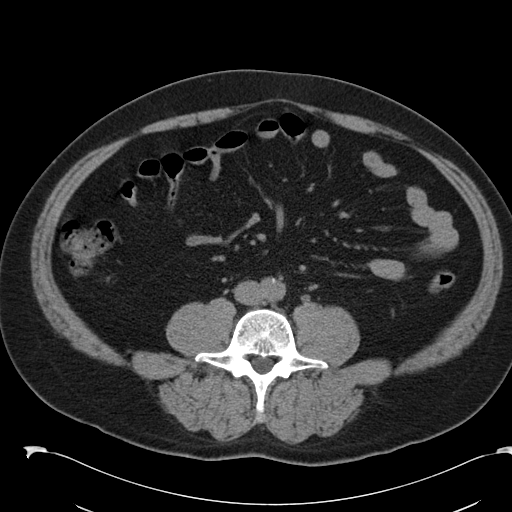
[im 56/94  soft-tissue]
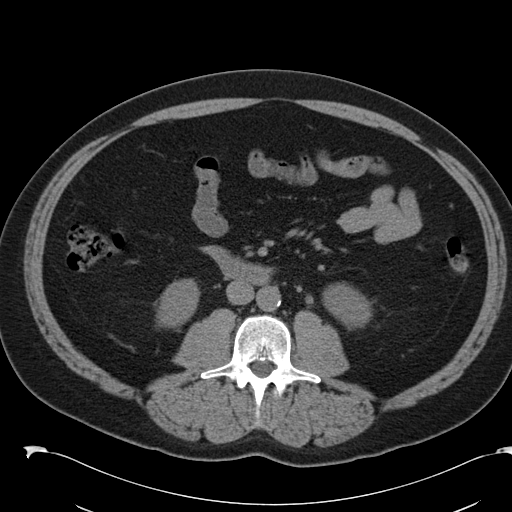
[im 56/94  bone]
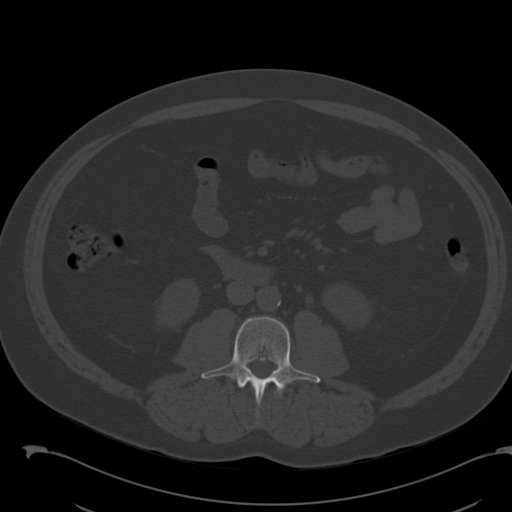
[im 64/94  soft-tissue]
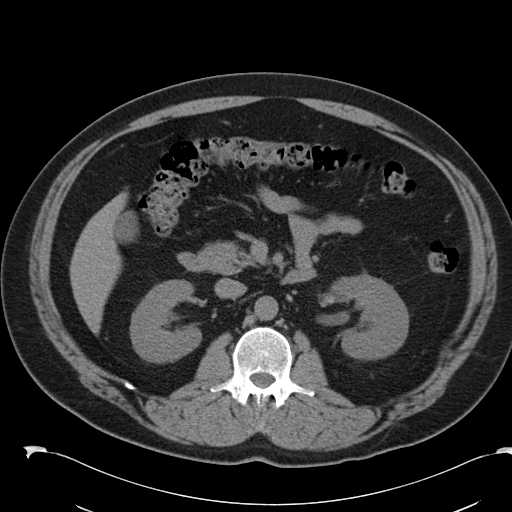
[im 71/94  soft-tissue]
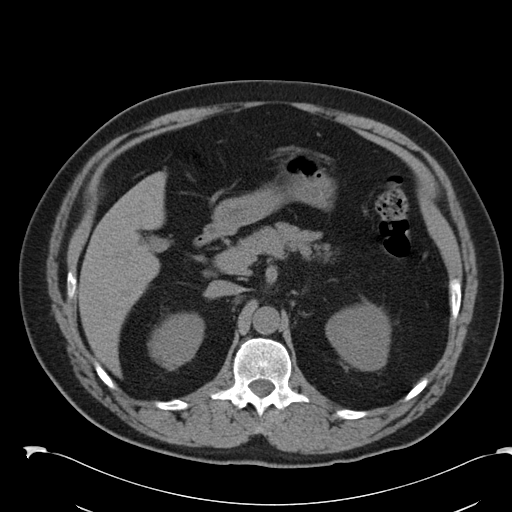
[im 75/94  soft-tissue]
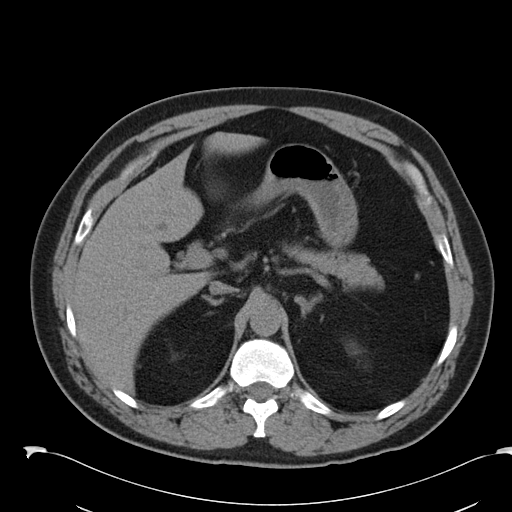
[im 82/94  soft-tissue]
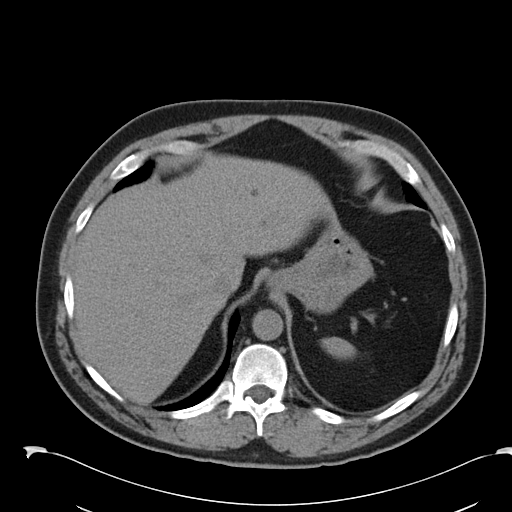
[im 90/94  soft-tissue]
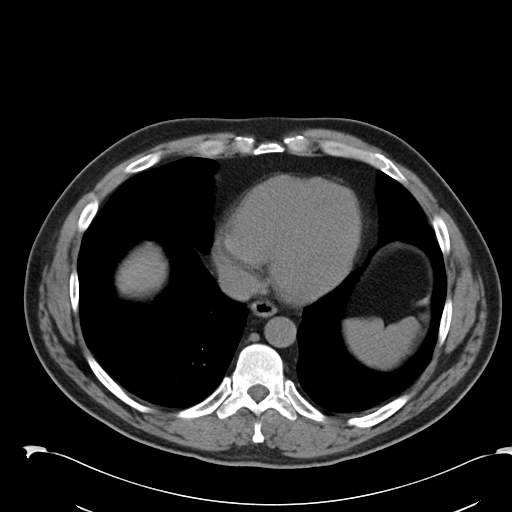

[Series 602: cor · coronal · 0.95mm/px · 3 of 138 slices shown]
[im 46/138  soft-tissue]
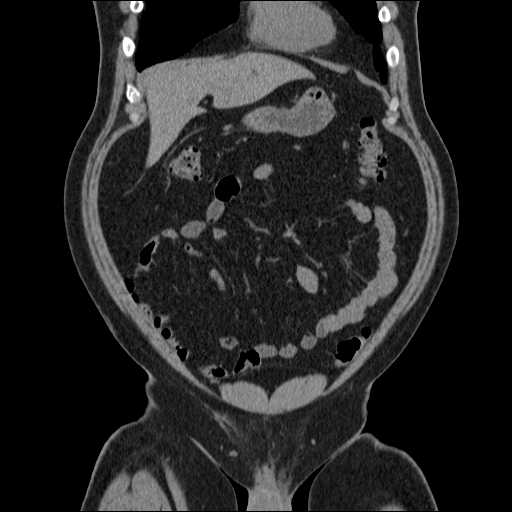
[im 61/138  soft-tissue]
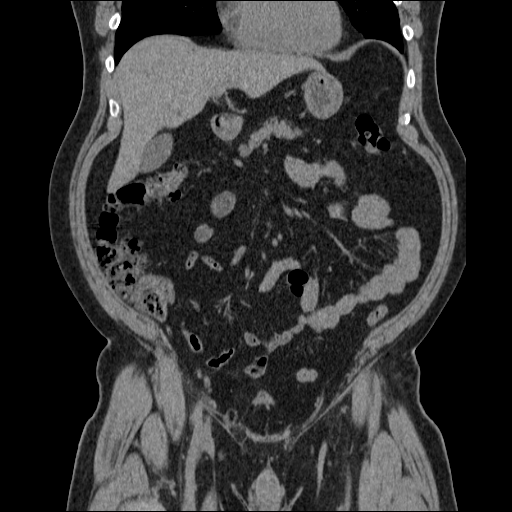
[im 77/138  soft-tissue]
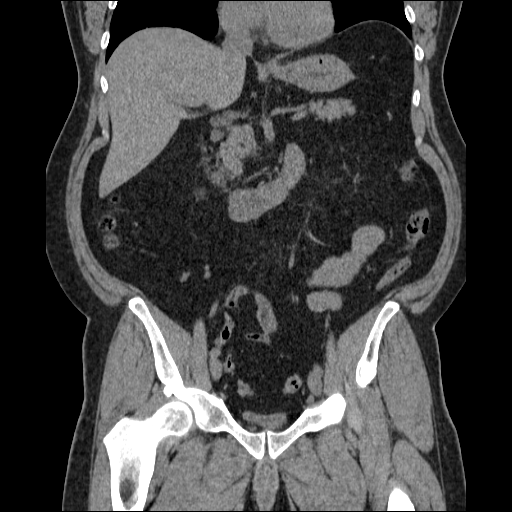

[17 of 46 positions shown; findings below may reference images not displayed]

FINDINGS: Trace left basilar pleural thickening or fluid noted.
The lung bases are otherwise clear.

There is mild left hydroureteronephrosis to the level of 4 mm
distal left ureteral calculus image 68.  This was previously
obscured by the sacrum on the dissimilar prior exam.  No other
radiopaque ureteral or left renal calculus is identified.  Possible
5 mm too small to characterize left upper renal pole hypodensity
identified on image 25, incompletely evaluated without contrast.
Possible nonobstructing 1 mm right upper renal pole calculus image
28 versus artifact.

Too small to characterize diffuse hepatic hypodensities measuring 5
mm and smaller are most compatible with cysts or biliary
hamartomas.  Spleen, adrenal glands, gallbladder, and pancreas are
normal.

No ascites or lymphadenopathy.  No free air.  Mild atheromatous
aortic calcification at the iliac bifurcation.  Small fat
containing right inguinal hernia.  Bladder is normal.  No bowel
wall thickening or focal segmental dilatation.  The appendix is
normal.

No acute osseous abnormality.
IMPRESSION: 4 mm distal left ureteral calculus producing minimal proximal left
hydroureteronephrosis.  This was obscured by the sacrum on the
previous dissimilar exam.

These results will be called to the ordering clinician or
representative by the Radiologist Assistant, and communication
documented in the PACS Dashboard.

## 2015-10-15 ENCOUNTER — Ambulatory Visit (HOSPITAL_COMMUNITY)
Admission: EM | Admit: 2015-10-15 | Discharge: 2015-10-15 | Disposition: A | Discharge status: Home / Self Care | Payer: 59 | Attending: Physician Assistant | Admitting: Physician Assistant

## 2015-10-15 ENCOUNTER — Encounter (HOSPITAL_COMMUNITY): Payer: Self-pay | Admitting: Emergency Medicine

## 2015-10-15 DIAGNOSIS — J209 Acute bronchitis, unspecified: Secondary | ICD-10-CM

## 2015-10-15 MED ORDER — ALBUTEROL SULFATE HFA 108 (90 BASE) MCG/ACT IN AERS: 1.0000 | INHALATION_SPRAY | Freq: Four times a day (QID) | RESPIRATORY_TRACT | 0 refills | Status: DC | PRN

## 2015-10-15 MED ORDER — IPRATROPIUM-ALBUTEROL 0.5-2.5 (3) MG/3ML IN SOLN
3.0000 mL | Freq: Once | RESPIRATORY_TRACT | Status: AC
  Administered 2015-10-15: 3 mL via RESPIRATORY_TRACT

## 2015-10-15 MED ORDER — AZITHROMYCIN 250 MG PO TABS: ORAL_TABLET | ORAL | 0 refills | Status: DC

## 2015-10-15 MED ORDER — IPRATROPIUM-ALBUTEROL 0.5-2.5 (3) MG/3ML IN SOLN
RESPIRATORY_TRACT | Status: AC
  Filled 2015-10-15: qty 3

## 2015-10-15 MED ORDER — HYDROCODONE-ACETAMINOPHEN 7.5-325 MG/15ML PO SOLN: 15.0000 mL | Freq: Four times a day (QID) | ORAL | 0 refills | Status: DC | PRN

## 2015-10-15 NOTE — ED Triage Notes (Signed)
[facility] a [facility] of a cough with chest congestion x 4 days.

## 2015-10-15 NOTE — Discharge Instructions (Signed)
USE CPAP FOR BREATHING AND LOWER BLOOD PRESSURE

## 2015-10-15 NOTE — ED Provider Notes (Signed)
CSN: [id number]     Arrival date & time 10/15/15  1002 History   First MD Initiated Contact with Patient 10/15/15 1052     Chief Complaint  Patient presents with  . Cough   (Consider location/radiation/quality/duration/timing/severity/associated sxs/prior Treatment) HPI Patient is a 60 year old gentleman with history of coughing for the last week. Cough is waking him from sleep he states he has started wheezing about 3 days ago. He does smoke daily but does not have a diagnosis of COPD or emphysema at this time. He does have CPAP machine at home for sleep apnea does not use it. States he is producing a yellowish gray sputum. Past medical history is significant for nonischemic cardiomyopathy. He does have an albuterol inhaler that has been prescribed for him it is unclear whether he is still using it at this time. Past Medical History:  Diagnosis Date  . Arthritis   . Barrett esophagus   . BPH (benign prostatic hypertrophy)   . Frequency of urination   . GERD (gastroesophageal reflux disease)   . H/O hiatal hernia   . History of prostatitis    HX RECURRENT  . Hyperlipidemia   . Left ureteral calculus   . Nonischemic cardiomyopathy (HCC) PER PT ON 11-17-2012 STATED HAS TAKEN HIS COREG FOR A YEAR    LAST LOV TO CARDIOLOGIST  2010-  PCP DR Oliver Barre   . Stenosis of both internal carotid arteries    MILD--  0-39%  PER DUPLEX OF 2011  . Urgency of urination    Past Surgical History:  Procedure Laterality Date  . CARDIAC CATHETERIZATION  05-15-2008   MILD GLOBAL HYPOKINESIS, WORSE INFERIORLY/  MILD DILATED LVSF/  EF 40-45%  . CARDIOVASCULAR STRESS TEST  04-03-2008   LOW RISK NUCLEAR STUDY/ EF 44%/ MILD HYPOKINESIS/ NO ISCHEMIA/  FIXED INFERIOR DEFECT (COULD BE ATTENUATION)  . CYSTOSCOPY WITH RETROGRADE PYELOGRAM, URETEROSCOPY AND STENT PLACEMENT Left 11/21/2012   Procedure: CYSTOSCOPY WITH RETROGRADE PYELOGRAM, URETEROSCOPY AND STENT PLACEMENT;  Surgeon: Lindaann Slough, MD;  Location:  [facility];  Service: Urology;  Laterality: Left;  . HOLMIUM LASER APPLICATION Left 11/21/2012   Procedure: HOLMIUM LASER APPLICATION;  Surgeon: Lindaann Slough, MD;  Location: [facility];  Service: Urology;  Laterality: Left;  . INGUINAL HERNIA REPAIR Right 1997  . TRANSTHORACIC ECHOCARDIOGRAM  04-03-2008   EF 45-50%/  MILD DIFFUSE LV HYPOKINESIS/  NO VALVE ABNORMALITIES   Family History  Problem Relation Age of Onset  . Colon polyps Neg Hx   . Colon cancer Neg Hx   . Rectal cancer Neg Hx   . Stomach cancer Neg Hx   . Heart disease Father     deceased-- CHF   Social History  Substance Use Topics  . Smoking status: Current Every Day Smoker    Packs/day: 1.00    Years: 42.00    Types: Cigarettes  . Smokeless tobacco: Never Used  . Alcohol use 7.2 oz/week    12 Cans of beer per week    Review of Systems  Denies: HEADACHE, NAUSEA, ABDOMINAL PAIN, CHEST PAIN, CONGESTION, DYSURIA, SHORTNESS OF BREATH  Allergies  Review of patient's allergies indicates no known allergies.  Home Medications   Prior to Admission medications   Medication Sig Start Date End Date Taking? Authorizing Provider  omeprazole (PRILOSEC) 20 MG capsule Take 20 mg by mouth every evening.    Yes Historical Provider, MD  albuterol (PROVENTIL HFA;VENTOLIN HFA) 108 (90 BASE) MCG/ACT inhaler Inhale 1-2 puffs into the  lungs every 6 (six) hours as needed for wheezing.    Domenick GongAshley Mortenson, MD  albuterol (PROVENTIL HFA;VENTOLIN HFA) 108 (90 BASE) MCG/ACT inhaler Inhale 1-2 puffs into the lungs every 6 (six) hours as needed for wheezing or shortness of breath (or persistent cough). 01/26/14   Mathis FareJennifer Lee H Presson, PA  aspirin 81 MG tablet Take 81 mg by mouth daily.      Historical Provider, MD  ibuprofen (ADVIL,MOTRIN) 600 MG tablet Take 1 tablet (600 mg total) by mouth every 8 (eight) hours as needed for pain. 03/17/12   Adlih Moreno-Coll, MD  Omega-3 Fatty Acids (FISH OIL) 500 MG CAPS  Take 1 capsule by mouth every other day.    Historical Provider, MD  polyethylene glycol powder (GLYCOLAX/MIRALAX) powder Take 17 g by mouth every other day. 08/28/12   Rodolph BongEvan S Corey, MD  predniSONE (DELTASONE) 10 MG tablet Take 5 po QD day 1, 4 po QD day 2, 3 po QD day 3, 2 po QD day 4 and 1 po QD day 5 then stop 01/26/14   Ria ClockJennifer Lee H Presson, PA   Meds Ordered and Administered this Visit  Medications - No data to display  BP 130/86 (BP Location: Left Arm)   Pulse 89   Temp 98 F (36.7 C) (Oral)   Resp 18   SpO2 98%  No data found.   Physical Exam NURSES NOTES AND VITAL SIGNS REVIEWED. CONSTITUTIONAL: Well developed, well nourished, no acute distress HEENT: normocephalic, atraumatic EYES: Conjunctiva normal NECK:normal ROM, supple, no adenopathy PULMONARY:No respiratory distress, normal effort, diffuse wheezing throughout the lung fields ABDOMINAL: Soft, ND, NT BS+, No CVAT MUSCULOSKELETAL: Normal ROM of all extremities,  SKIN: warm and dry without rash PSYCHIATRIC: Mood and affect, behavior are normal  Urgent Care Course   Clinical Course  POST NEB: WHEEZING DECREASED PT STATES HE IS MUCH MORE COMFORTABLE   Procedures (including critical care time)  Labs Review Labs Reviewed - No data to display  Imaging Review No results found.   Visual Acuity Review  Right Eye Distance:   Left Eye Distance:   Bilateral Distance:    Right Eye Near:   Left Eye Near:    Bilateral Near:         MDM   1. Acute bronchitis, unspecified organism     Patient is reassured that there are no issues that require transfer to higher level of care at this time or additional tests. Patient is advised to continue home symptomatic treatment. Patient is advised that if there are new or worsening symptoms to attend the emergency department, contact primary care provider, or return to UC. Instructions of care provided discharged home in stable condition.    THIS NOTE WAS GENERATED  USING A VOICE RECOGNITION SOFTWARE PROGRAM. ALL REASONABLE EFFORTS  WERE MADE TO PROOFREAD THIS DOCUMENT FOR ACCURACY.  I have verbally reviewed the discharge instructions with the patient. A printed AVS was given to the patient.  All questions were answered prior to discharge.      Tharon AquasFrank C Patrick, PA 10/15/15 1153

## 2016-05-23 ENCOUNTER — Ambulatory Visit (HOSPITAL_COMMUNITY)
Admission: EM | Admit: 2016-05-23 | Discharge: 2016-05-23 | Disposition: A | Discharge status: Home / Self Care | Payer: 59 | Attending: Family Medicine | Admitting: Family Medicine

## 2016-05-23 ENCOUNTER — Encounter (HOSPITAL_COMMUNITY): Payer: Self-pay | Admitting: Emergency Medicine

## 2016-05-23 DIAGNOSIS — L03115 Cellulitis of right lower limb: Secondary | ICD-10-CM

## 2016-05-23 MED ORDER — DOXYCYCLINE HYCLATE 100 MG PO TABS
100.0000 mg | ORAL_TABLET | Freq: Two times a day (BID) | ORAL | 0 refills | Status: DC
Start: 2016-05-23 — End: 2020-09-28

## 2016-05-23 NOTE — ED Provider Notes (Signed)
[facility]    CSN: [id number] Arrival date & time: 05/23/16  1209     History   Chief Complaint Chief Complaint  Patient presents with  . Insect Bite    HPI Jeffery Frye is a 61 y.o. male.   The patient presented to the [facility] with a complaint of a possible insect bite to the lower right leg x 1 week.  Patient has had a boil in the past but was on his gluteal area.  Patient at his home had a free fall and destroy the roof. It also destroyed couple cars in his yard. He works for the city of [place].      Past Medical History:  Diagnosis Date  . Arthritis   . Barrett esophagus   . BPH (benign prostatic hypertrophy)   . Frequency of urination   . GERD (gastroesophageal reflux disease)   . H/O hiatal hernia   . History of prostatitis    HX RECURRENT  . Hyperlipidemia   . Left ureteral calculus   . Nonischemic cardiomyopathy (HCC) PER PT ON 11-17-2012 STATED HAS TAKEN HIS COREG FOR A YEAR    LAST LOV TO CARDIOLOGIST  2010-  PCP DR Oliver Barre   . Stenosis of both internal carotid arteries    MILD--  0-39%  PER DUPLEX OF 2011  . Urgency of urination     Patient Active Problem List   Diagnosis Date Noted  . OSA (obstructive sleep apnea) 02/09/2013  . Preventative health care 08/31/2012  . CAD (coronary artery disease)   . Gross hematuria 08/30/2012  . Abdominal pain, other specified site 08/30/2012  . CAROTID ARTERY DISEASE 04/15/2009  . TOBACCO ABUSE 05/07/2008  . CARDIOMYOPATHY 04/04/2008  . HYPERLIPIDEMIA 03/27/2008  . Acute prostatitis 03/27/2008  . DIZZINESS 03/27/2008  . GERD 06/07/2007  . UNSPECIFIED HEPATITIS 06/07/2007  . BENIGN PROSTATIC HYPERTROPHY 06/07/2007  . COLONIC POLYPS, HX OF 06/07/2007  . BARRETT'S ESOPHAGUS, HX OF 06/07/2007    Past Surgical History:  Procedure Laterality Date  . CARDIAC CATHETERIZATION  05-15-2008   MILD GLOBAL HYPOKINESIS, WORSE INFERIORLY/  MILD DILATED LVSF/  EF 40-45%  . CARDIOVASCULAR STRESS TEST   04-03-2008   LOW RISK NUCLEAR STUDY/ EF 44%/ MILD HYPOKINESIS/ NO ISCHEMIA/  FIXED INFERIOR DEFECT (COULD BE ATTENUATION)  . CYSTOSCOPY WITH RETROGRADE PYELOGRAM, URETEROSCOPY AND STENT PLACEMENT Left 11/21/2012   Procedure: CYSTOSCOPY WITH RETROGRADE PYELOGRAM, URETEROSCOPY AND STENT PLACEMENT;  Surgeon: Lindaann Slough, MD;  Location: [facility];  Service: Urology;  Laterality: Left;  . HOLMIUM LASER APPLICATION Left 11/21/2012   Procedure: HOLMIUM LASER APPLICATION;  Surgeon: Lindaann Slough, MD;  Location: [facility];  Service: Urology;  Laterality: Left;  . INGUINAL HERNIA REPAIR Right 1997  . TRANSTHORACIC ECHOCARDIOGRAM  04-03-2008   EF 45-50%/  MILD DIFFUSE LV HYPOKINESIS/  NO VALVE ABNORMALITIES       Home Medications    Prior to Admission medications   Medication Sig Start Date End Date Taking? Authorizing Provider  aspirin 81 MG tablet Take 81 mg by mouth daily.     Yes Historical Provider, MD  Omega-3 Fatty Acids (FISH OIL) 500 MG CAPS Take 1 capsule by mouth every other day.   Yes Historical Provider, MD  omeprazole (PRILOSEC) 20 MG capsule Take 20 mg by mouth every evening.    Yes Historical Provider, MD  tadalafil (CIALIS) 10 MG tablet Take 10 mg by mouth daily as needed for erectile dysfunction.   Yes Historical  Provider, MD  doxycycline (VIBRA-TABS) 100 MG tablet Take 1 tablet (100 mg total) by mouth 2 (two) times daily. 05/23/16   Elvina Sidle, MD    Family History Family History  Problem Relation Age of Onset  . Colon polyps Neg Hx   . Colon cancer Neg Hx   . Rectal cancer Neg Hx   . Stomach cancer Neg Hx   . Heart disease Father     deceased-- CHF    Social History Social History  Substance Use Topics  . Smoking status: Current Every Day Smoker    Packs/day: 1.00    Years: 42.00    Types: Cigarettes  . Smokeless tobacco: Never Used  . Alcohol use 7.2 oz/week    12 Cans of beer per week     Allergies   Patient has  no known allergies.   Review of Systems Review of Systems  Constitutional: Negative.   Skin: Positive for rash.  All other systems reviewed and are negative.    Physical Exam Triage Vital Signs ED Triage Vitals  Enc Vitals Group     BP 05/23/16 1253 124/72     Pulse Rate 05/23/16 1253 79     Resp 05/23/16 1253 16     Temp 05/23/16 1253 98.1 F (36.7 C)     Temp Source 05/23/16 1253 Oral     SpO2 05/23/16 1253 100 %     Weight --      Height --      Head Circumference --      Peak Flow --      Pain Score 05/23/16 1257 4     Pain Loc --      Pain Edu? --      Excl. in GC? --    No data found.   Updated Vital Signs BP 124/72 (BP Location: Left Arm)   Pulse 79   Temp 98.1 F (36.7 C) (Oral)   Resp 16   SpO2 100%    Physical Exam  Constitutional: He is oriented to person, place, and time. He appears well-developed and well-nourished.  HENT:  Right Ear: External ear normal.  Left Ear: External ear normal.  Mouth/Throat: Oropharynx is clear and moist.  Eyes: Conjunctivae are normal. Pupils are equal, round, and reactive to light.  Neck: Normal range of motion. Neck supple.  Pulmonary/Chest: Effort normal.  Musculoskeletal: Normal range of motion.  Neurological: He is alert and oriented to person, place, and time. Coordination normal.  Skin: Skin is warm and dry. There is erythema.  Nursing note and vitals reviewed.  2 separate areas overlying the Achilles tendon marked by erythematous patch and mild induration without fluctuance.  UC Treatments / Results  Labs (all labs ordered are listed, but only abnormal results are displayed) Labs Reviewed - No data to display  EKG  EKG Interpretation None       Radiology No results found.  Procedures Procedures (including critical care time)  Medications Ordered in UC Medications - No data to display   Initial Impression / Assessment and Plan / UC Course  I have reviewed the triage vital signs and the  nursing notes.  Pertinent labs & imaging results that were available during my care of the patient were reviewed by me and considered in my medical decision making (see chart for details).     Final Clinical Impressions(s) / UC Diagnoses   Final diagnoses:  Cellulitis of right lower extremity    New Prescriptions New Prescriptions  DOXYCYCLINE (VIBRA-TABS) 100 MG TABLET    Take 1 tablet (100 mg total) by mouth 2 (two) times daily.     Elvina Sidle, MD 05/23/16 1320

## 2016-05-23 NOTE — ED Triage Notes (Signed)
The patient presented to the [facility] with a complaint of a possible insect bite to the lower right leg x 1 week.

## 2017-03-07 ENCOUNTER — Ambulatory Visit (HOSPITAL_COMMUNITY)
Admission: EM | Admit: 2017-03-07 | Discharge: 2017-03-07 | Disposition: A | Discharge status: Home / Self Care | Payer: 59 | Attending: Family Medicine | Admitting: Family Medicine

## 2017-03-07 ENCOUNTER — Encounter (HOSPITAL_COMMUNITY): Payer: Self-pay | Admitting: *Deleted

## 2017-03-07 DIAGNOSIS — J069 Acute upper respiratory infection, unspecified: Secondary | ICD-10-CM

## 2017-03-07 NOTE — ED Triage Notes (Signed)
Patient reports he started off with nasal drainage and now has nasal congestion. Reports sweats. Has taken otc meds to help relieve symptoms. Patient states he has had mild dizziness and indigestion.

## 2017-03-07 NOTE — Discharge Instructions (Signed)
Continue the over the counter medicines you are currently taking.

## 2017-03-07 NOTE — ED Provider Notes (Signed)
[facility]   [id number] 03/07/17 Arrival Time: 1249  ASSESSMENT & PLAN:  1. Viral upper respiratory tract infection    Discussed typical duration of symptoms. OTC symptom care as needed. Ensure adequate fluid intake and rest. May f/u with PCP or here as needed.  Reviewed expectations re: course of current medical issues. Questions answered. Outlined signs and symptoms indicating need for more acute intervention. Patient verbalized understanding. After Visit Summary given.   SUBJECTIVE: History from: patient.  Carrolyn MeiersWilliam M Lister is a 62 y.o. male who presents with complaint of nasal congestion, post-nasal drainage, and a persistent dry cough. Onset abrupt, approximately 3-4 days ago. Some fatigue. SOB: none. Wheezing: none. Fever: no. Overall normal PO intake without n/v. Sick contacts: no. OTC treatment: none. Occasional "lightheaded" feeling; transient; few seconds then gone. No dysequilibrium. Hearing normal. "Feel like my ears are stopped up."  Received flu shot this year: no.  Social History   Tobacco Use  Smoking Status Current Every Day Smoker  . Packs/day: 1.00  . Years: 42.00  . Pack years: 42.00  . Types: Cigarettes  Smokeless Tobacco Never Used    ROS: As per HPI.   OBJECTIVE:  Vitals:   03/07/17 1421  BP: 122/81  Pulse: 78  Resp: 16  Temp: 98.2 F (36.8 C)  TempSrc: Oral  SpO2: 96%     General appearance: alert; appears fatigued HEENT: nasal congestion; clear runny nose; throat irritation secondary to post-nasal drainage Neck: supple without LAD Lungs: unlabored respirations, symmetrical air entry; cough: mild; no respiratory distress Skin: warm and dry Psychological: alert and cooperative; normal mood and affect   No Known Allergies  Past Medical History:  Diagnosis Date  . Arthritis   . Barrett esophagus   . BPH (benign prostatic hypertrophy)   . Frequency of urination   . GERD (gastroesophageal reflux disease)   . H/O  hiatal hernia   . History of prostatitis    HX RECURRENT  . Hyperlipidemia   . Left ureteral calculus   . Nonischemic cardiomyopathy (HCC) PER PT ON 11-17-2012 STATED HAS TAKEN HIS COREG FOR A YEAR    LAST LOV TO CARDIOLOGIST  2010-  PCP DR Oliver BarreJAMES JOHN   . Stenosis of both internal carotid arteries    MILD--  0-39%  PER DUPLEX OF 2011  . Urgency of urination    Family History  Problem Relation Age of Onset  . Heart disease Father        deceased-- CHF  . Colon polyps Neg Hx   . Colon cancer Neg Hx   . Rectal cancer Neg Hx   . Stomach cancer Neg Hx    Social History   Socioeconomic History  . Marital status: Married    Spouse name: Not on file  . Number of children: Not on file  . Years of education: Not on file  . Highest education level: Not on file  Social Needs  . Financial resource strain: Not on file  . Food insecurity - worry: Not on file  . Food insecurity - inability: Not on file  . Transportation needs - medical: Not on file  . Transportation needs - non-medical: Not on file  Occupational History  . Occupation: [employer]  Tobacco Use  . Smoking status: Current Every Day Smoker    Packs/day: 1.00    Years: 42.00    Pack years: 42.00    Types: Cigarettes  . Smokeless tobacco: Never Used  Substance and Sexual Activity  .  Alcohol use: Yes    Alcohol/week: 7.2 oz    Types: 12 Cans of beer per week  . Drug use: No  . Sexual activity: Not on file  Other Topics Concern  . Not on file  Social History Narrative  . Not on file           Mardella Layman, MD 03/07/17 [phone]

## 2020-09-28 ENCOUNTER — Ambulatory Visit (HOSPITAL_COMMUNITY)
Admission: EM | Admit: 2020-09-28 | Discharge: 2020-09-28 | Disposition: A | Discharge status: Home / Self Care | Payer: 59 | Attending: Student | Admitting: Student

## 2020-09-28 ENCOUNTER — Encounter (HOSPITAL_COMMUNITY): Payer: Self-pay

## 2020-09-28 DIAGNOSIS — Z87438 Personal history of other diseases of male genital organs: Secondary | ICD-10-CM | POA: Insufficient documentation

## 2020-09-28 DIAGNOSIS — Z113 Encounter for screening for infections with a predominantly sexual mode of transmission: Secondary | ICD-10-CM | POA: Diagnosis not present

## 2020-09-28 DIAGNOSIS — R350 Frequency of micturition: Secondary | ICD-10-CM | POA: Diagnosis not present

## 2020-09-28 DIAGNOSIS — N401 Enlarged prostate with lower urinary tract symptoms: Secondary | ICD-10-CM | POA: Insufficient documentation

## 2020-09-28 DIAGNOSIS — R300 Dysuria: Secondary | ICD-10-CM | POA: Diagnosis not present

## 2020-09-28 LAB — POCT URINALYSIS DIPSTICK, ED / UC
Bilirubin Urine: NEGATIVE
Glucose, UA: NEGATIVE mg/dL
Hgb urine dipstick: NEGATIVE
Ketones, ur: NEGATIVE mg/dL
Leukocytes,Ua: NEGATIVE
Nitrite: NEGATIVE
Protein, ur: NEGATIVE mg/dL
Specific Gravity, Urine: 1.01 (ref 1.005–1.030)
Urobilinogen, UA: 0.2 mg/dL (ref 0.0–1.0)
pH: 5.5 (ref 5.0–8.0)

## 2020-09-28 MED ORDER — LIDOCAINE HCL (PF) 1 % IJ SOLN
INTRAMUSCULAR | Status: AC
  Filled 2020-09-28: qty 2

## 2020-09-28 MED ORDER — DOXYCYCLINE HYCLATE 100 MG PO CAPS: 100.0000 mg | ORAL_CAPSULE | Freq: Two times a day (BID) | ORAL | 0 refills | Status: AC

## 2020-09-28 MED ORDER — CEFTRIAXONE SODIUM 500 MG IJ SOLR
INTRAMUSCULAR | Status: AC
  Filled 2020-09-28: qty 500

## 2020-09-28 MED ORDER — CEFTRIAXONE SODIUM 500 MG IJ SOLR
500.0000 mg | Freq: Once | INTRAMUSCULAR | Status: AC
  Administered 2020-09-28: 500 mg via INTRAMUSCULAR

## 2020-09-28 NOTE — ED Provider Notes (Signed)
[facility]    CSN: [id number] Arrival date & time: 09/28/20  1010      History   Chief Complaint Chief Complaint  Patient presents with   Penis Pain    HPI Jeffery Frye is a 65 y.o. male presenting with dysuria x3 days following unprotected intercourse with new male partner.  Medical history BPH, recurrent prostatitis.  Describes 3 days of dysuria, constant but worse with urination.  Denies other symptoms, including new abdominal pain, new flank pain, new midline back pain.  Denies fever/chills. Denies hematuria, frequency, urgency, back pain, n/v/d/abd pain, fevers/chills, abdnormal penile discharge, penile rashes/lesions, penile or testicular swelling.  States that he takes a pill for his prostate, but he is not sure what is called.   HPI  Past Medical History:  Diagnosis Date   Arthritis    Barrett esophagus    BPH (benign prostatic hypertrophy)    Frequency of urination    GERD (gastroesophageal reflux disease)    H/O hiatal hernia    History of prostatitis    HX RECURRENT   Hyperlipidemia    Left ureteral calculus    Nonischemic cardiomyopathy (HCC) PER PT ON 11-17-2012 STATED HAS TAKEN HIS COREG FOR A YEAR    LAST LOV TO CARDIOLOGIST  2010-  PCP DR Oliver Barre    Stenosis of both internal carotid arteries    MILD--  0-39%  PER DUPLEX OF 2011   Urgency of urination     Patient Active Problem List   Diagnosis Date Noted   OSA (obstructive sleep apnea) 02/09/2013   Preventative health care 08/31/2012   CAD (coronary artery disease)    Gross hematuria 08/30/2012   Abdominal pain, other specified site 08/30/2012   CAROTID ARTERY DISEASE 04/15/2009   TOBACCO ABUSE 05/07/2008   CARDIOMYOPATHY 04/04/2008   HYPERLIPIDEMIA 03/27/2008   Acute prostatitis 03/27/2008   DIZZINESS 03/27/2008   GERD 06/07/2007   UNSPECIFIED HEPATITIS 06/07/2007   BENIGN PROSTATIC HYPERTROPHY 06/07/2007   COLONIC POLYPS, HX OF 06/07/2007   BARRETT'S ESOPHAGUS, HX OF  06/07/2007    Past Surgical History:  Procedure Laterality Date   CARDIAC CATHETERIZATION  05-15-2008   MILD GLOBAL HYPOKINESIS, WORSE INFERIORLY/  MILD DILATED LVSF/  EF 40-45%   CARDIOVASCULAR STRESS TEST  04-03-2008   LOW RISK NUCLEAR STUDY/ EF 44%/ MILD HYPOKINESIS/ NO ISCHEMIA/  FIXED INFERIOR DEFECT (COULD BE ATTENUATION)   CYSTOSCOPY WITH RETROGRADE PYELOGRAM, URETEROSCOPY AND STENT PLACEMENT Left 11/21/2012   Procedure: CYSTOSCOPY WITH RETROGRADE PYELOGRAM, URETEROSCOPY AND STENT PLACEMENT;  Surgeon: Lindaann Slough, MD;  Location: [facility] [place];  Service: Urology;  Laterality: Left;   HOLMIUM LASER APPLICATION Left 11/21/2012   Procedure: HOLMIUM LASER APPLICATION;  Surgeon: Lindaann Slough, MD;  Location: [facility];  Service: Urology;  Laterality: Left;   INGUINAL HERNIA REPAIR Right 1997   TRANSTHORACIC ECHOCARDIOGRAM  04-03-2008   EF 45-50%/  MILD DIFFUSE LV HYPOKINESIS/  NO VALVE ABNORMALITIES       Home Medications    Prior to Admission medications   Medication Sig Start Date End Date Taking? Authorizing Provider  doxycycline (VIBRAMYCIN) 100 MG capsule Take 1 capsule (100 mg total) by mouth 2 (two) times daily for 7 days. 09/28/20 10/05/20 Yes Rhys Martini, PA-C  aspirin 81 MG tablet Take 81 mg by mouth daily.      [provider]  Omega-3 Fatty Acids (FISH OIL) 500 MG CAPS Take 1 capsule by mouth every other day.  [provider]  omeprazole (PRILOSEC) 20 MG capsule Take 20 mg by mouth every evening.     [provider]  tadalafil (CIALIS) 10 MG tablet Take 10 mg by mouth daily as needed for erectile dysfunction.    [provider]    Family History Family History  Problem Relation Age of Onset   Heart disease Father        deceased-- CHF   Colon polyps Neg Hx    Colon cancer Neg Hx    Rectal cancer Neg Hx    Stomach cancer Neg Hx     Social History Social History   Tobacco Use   Smoking  status: Every Day    Packs/day: 1.00    Years: 42.00    Pack years: 42.00    Types: Cigarettes   Smokeless tobacco: Never  Substance Use Topics   Alcohol use: Yes    Alcohol/week: 12.0 standard drinks    Types: 12 Cans of beer per week   Drug use: No     Allergies   Patient has no known allergies.   Review of Systems Review of Systems  Constitutional:  Negative for chills and fever.  HENT:  Negative for sore throat.   Eyes:  Negative for pain and redness.  Respiratory:  Negative for shortness of breath.   Cardiovascular:  Negative for chest pain.  Gastrointestinal:  Negative for abdominal pain, diarrhea, nausea and vomiting.  Genitourinary:  Positive for dysuria. Negative for decreased urine volume, difficulty urinating, flank pain, frequency, genital sores, hematuria, penile discharge, penile pain, penile swelling, scrotal swelling, testicular pain and urgency.  Musculoskeletal:  Negative for back pain.  Skin:  Negative for rash.  All other systems reviewed and are negative.   Physical Exam Triage Vital Signs ED Triage Vitals  Enc Vitals Group     BP 09/28/20 1105 134/82     Pulse Rate 09/28/20 1105 (!) 54     Resp 09/28/20 1105 18     Temp 09/28/20 1105 98.1 F (36.7 C)     Temp Source 09/28/20 1105 Oral     SpO2 09/28/20 1105 96 %     Weight --      Height --      Head Circumference --      Peak Flow --      Pain Score 09/28/20 1104 8     Pain Loc --      Pain Edu? --      Excl. in GC? --    No data found.  Updated Vital Signs BP 134/82 (BP Location: Left Arm)   Pulse (!) 54   Temp 98.1 F (36.7 C) (Oral)   Resp 18   SpO2 96%   Visual Acuity Right Eye Distance:   Left Eye Distance:   Bilateral Distance:    Right Eye Near:   Left Eye Near:    Bilateral Near:     Physical Exam Vitals reviewed.  Constitutional:      General: He is not in acute distress.    Appearance: Normal appearance. He is not ill-appearing.  HENT:     Head:  Normocephalic and atraumatic.     Mouth/Throat:     Mouth: Mucous membranes are moist.     Comments: Moist mucous membranes Eyes:     Extraocular Movements: Extraocular movements intact.     Pupils: Pupils are equal, round, and reactive to light.  Cardiovascular:     Rate and Rhythm: Normal rate and  regular rhythm.     Heart sounds: Normal heart sounds.  Pulmonary:     Effort: Pulmonary effort is normal.     Breath sounds: Normal breath sounds. No wheezing, rhonchi or rales.  Abdominal:     General: Bowel sounds are normal. There is no distension.     Palpations: Abdomen is soft. There is no mass.     Tenderness: There is no abdominal tenderness. There is no right CVA tenderness, left CVA tenderness, guarding or rebound.  Genitourinary:    Comments: deferred Musculoskeletal:     Comments: No midline back pain, no flank pain  Skin:    General: Skin is warm.     Capillary Refill: Capillary refill takes less than 2 seconds.     Comments: Good skin turgor  Neurological:     General: No focal deficit present.     Mental Status: He is alert and oriented to person, place, and time.  Psychiatric:        Mood and Affect: Mood normal.        Behavior: Behavior normal.     UC Treatments / Results  Labs (all labs ordered are listed, but only abnormal results are displayed) Labs Reviewed  URINE CULTURE  POCT URINALYSIS DIPSTICK, ED / UC  CYTOLOGY, (ORAL, ANAL, URETHRAL) ANCILLARY ONLY    EKG   Radiology No results found.  Procedures Procedures (including critical care time)  Medications Ordered in UC Medications  cefTRIAXone (ROCEPHIN) injection 500 mg (has no administration in time range)    Initial Impression / Assessment and Plan / UC Course  I have reviewed the triage vital signs and the nursing notes.  Pertinent labs & imaging results that were available during my care of the patient were reviewed by me and considered in my medical decision making (see chart for  details).     This patient is a very pleasant 65 y.o. year old male presenting with suspected STI- dysuria following new partner. Afebrile, nontachycardic, no reproducible abd pain or CVAT. No midline back pain.   New male partner 1 week before symptoms began. Suspect G/C or trich. Will send self-swab for G/C, trich. Declines HIV, RPR. Safe sex precautions.   History recurrent prostatitis. UA wnl. Given history BPH and prostatitis will check a culture to rule out prostatitis.   Will treat with rocephin and doxycycline while awaiting test results   ED return precautions discussed. Patient verbalizes understanding and agreement.    Final Clinical Impressions(s) / UC Diagnoses   Final diagnoses:  Benign prostatic hyperplasia with urinary frequency  Routine screening for STI (sexually transmitted infection)  Dysuria  History of prostatitis     Discharge Instructions      -With your symptoms, i'm concerned you have an STI (gonorrhea, chlamydia, or trichomonas).  -We treated you for gonorrhea with a shot of an antibiotic today. -For chlamydia, doxycycline twice daily for 7 days.  Make sure to wear sunscreen while spending time outside while on this medication as it can increase your chance of sunburn. You can take this medication with food if you have a sensitive stomach. -We'll call you in 2-3 days if anything else is positive and we need to send additional treatment. -Abstain from intercourse until treatment is complete -Come back and see us if symptoms get worse- penile pain penile discharge, new back pain, etc.       ED Prescriptions     Medication Sig Dispense Auth. Provider   doxycycline (VIBRAMYCIN) 100 MG capsule  Take 1 capsule (100 mg total) by mouth 2 (two) times daily for 7 days. 14 capsule Rhys Martini, PA-C      PDMP not reviewed this encounter.   Rhys Martini, PA-C 09/28/20 1139

## 2020-09-28 NOTE — ED Triage Notes (Signed)
Pt reports penis pain x 5 days, states is related to his prostate is big.

## 2020-09-28 NOTE — Discharge Instructions (Addendum)
-  With your symptoms, i'm concerned you have an STI (gonorrhea, chlamydia, or trichomonas).  -We treated you for gonorrhea with a shot of an antibiotic today. -For chlamydia, doxycycline twice daily for 7 days.  Make sure to wear sunscreen while spending time outside while on this medication as it can increase your chance of sunburn. You can take this medication with food if you have a sensitive stomach. -We'll call you in 2-3 days if anything else is positive and we need to send additional treatment. -Abstain from intercourse until treatment is complete -Come back and see [place] if symptoms get worse- penile pain penile discharge, new back pain, etc.

## 2020-09-30 LAB — CYTOLOGY, (ORAL, ANAL, URETHRAL) ANCILLARY ONLY
Chlamydia: NEGATIVE
Comment: NEGATIVE
Comment: NEGATIVE
Comment: NORMAL
Neisseria Gonorrhea: NEGATIVE
Trichomonas: NEGATIVE

## 2020-09-30 LAB — URINE CULTURE: Culture: NO GROWTH

## 2020-10-11 ENCOUNTER — Encounter (HOSPITAL_COMMUNITY): Payer: Self-pay | Admitting: Emergency Medicine

## 2020-10-11 ENCOUNTER — Other Ambulatory Visit: Payer: Self-pay

## 2020-10-11 ENCOUNTER — Ambulatory Visit (HOSPITAL_COMMUNITY)
Admission: EM | Admit: 2020-10-11 | Discharge: 2020-10-11 | Disposition: A | Discharge status: Home / Self Care | Payer: 59 | Attending: Emergency Medicine | Admitting: Emergency Medicine

## 2020-10-11 DIAGNOSIS — N342 Other urethritis: Secondary | ICD-10-CM | POA: Diagnosis not present

## 2020-10-11 LAB — CBC WITH DIFFERENTIAL/PLATELET
Abs Immature Granulocytes: 0.02 10*3/uL (ref 0.00–0.07)
Basophils Absolute: 0 10*3/uL (ref 0.0–0.1)
Basophils Relative: 0 %
Eosinophils Absolute: 0.1 10*3/uL (ref 0.0–0.5)
Eosinophils Relative: 2 %
HCT: 48.5 % (ref 39.0–52.0)
Hemoglobin: 16.2 g/dL (ref 13.0–17.0)
Immature Granulocytes: 0 %
Lymphocytes Relative: 30 %
Lymphs Abs: 2.3 10*3/uL (ref 0.7–4.0)
MCH: 32.7 pg (ref 26.0–34.0)
MCHC: 33.4 g/dL (ref 30.0–36.0)
MCV: 98 fL (ref 80.0–100.0)
Monocytes Absolute: 0.5 10*3/uL (ref 0.1–1.0)
Monocytes Relative: 6 %
Neutro Abs: 4.8 10*3/uL (ref 1.7–7.7)
Neutrophils Relative %: 62 %
Platelets: 255 10*3/uL (ref 150–400)
RBC: 4.95 MIL/uL (ref 4.22–5.81)
RDW: 12.1 % (ref 11.5–15.5)
WBC: 7.8 10*3/uL (ref 4.0–10.5)
nRBC: 0 % (ref 0.0–0.2)

## 2020-10-11 LAB — POCT URINALYSIS DIPSTICK, ED / UC
Bilirubin Urine: NEGATIVE
Glucose, UA: NEGATIVE mg/dL
Hgb urine dipstick: NEGATIVE
Ketones, ur: 40 mg/dL — AB
Leukocytes,Ua: NEGATIVE
Nitrite: NEGATIVE
Protein, ur: NEGATIVE mg/dL
Specific Gravity, Urine: 1.01 (ref 1.005–1.030)
Urobilinogen, UA: 0.2 mg/dL (ref 0.0–1.0)
pH: 5 (ref 5.0–8.0)

## 2020-10-11 LAB — HIV ANTIBODY (ROUTINE TESTING W REFLEX): HIV Screen 4th Generation wRfx: NONREACTIVE

## 2020-10-11 MED ORDER — CEPHALEXIN 500 MG PO CAPS: 500.0000 mg | ORAL_CAPSULE | Freq: Four times a day (QID) | ORAL | 0 refills | Status: AC

## 2020-10-11 NOTE — Discharge Instructions (Addendum)
Jeffery Frye, it was a pleasure to meet you today.  My top diagnosis is urethritis which is a nonspecific diagnosis meaning inflammation of the urethra.  As we discussed, your previous test results were all negative, there was no concern for any sort of infection.  As we discussed also discussed today, I will prescribe for you a more broad-spectrum antibiotic to cover you for possible other underlying causes of inflammation of your urethra.  Please be sure you keep your appointment with urology for further evaluation which may include a cystoscopy, a camera placed inside the penis up into the bladder to look for other causes of your pain.

## 2020-10-11 NOTE — ED Triage Notes (Signed)
PT reports pain at the tip of his penis and around rectum. States he took 4 days of his abx last time, but the  pain got so bad he discontinued the abx.

## 2020-10-11 NOTE — ED Provider Notes (Addendum)
[facility]    CSN: [id number] Arrival date & time: 10/11/20  1013      History   Chief Complaint Chief Complaint  Patient presents with   Penis Pain    HPI Jeffery Frye is a 65 y.o. male.   Patient reports being seen in urgent care on September 28, 2020 for penile pain which she states has now spread to his rectum.  Patient denies penile discharge, erythema or pruritus of the penis or rectal area, genital lesion, joint pain, rash otherwise.  Patient states that his penile pain is more in the shaft of his penis and not at the tip.  Patient states he is currently prescribed Flomax for enlarged prostate however does not take it daily because it makes him feel "swimmy headed".  Patient states he took doxycycline as prescribed for 4 days but discontinued it because he felt like it was making his penile pain worse.  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  Patient states he is tried an over-the-counter preparation called OCA, states this made his penile pain worse as well.  Patient states he drinks plenty of water.  Patient states he has an appointment with urology later this month, he is here today seeking medication to relieve his pain until then.  Patient states pain is constant, not particularly worse with urination.  Previous results from August 28 were reviewed with patient, all testing was unremarkable.  Patient denies known exposure to sexually transmitted disease.  Patient states he has not been swimming in any rivers, lakes or oceans.  Patient denies overseas travel.  Patient reports having sexual intercourse with a partner who recently found out she has chronic urinary tract infections.    Past Medical History:  Diagnosis Date   Arthritis    Barrett esophagus    BPH (benign prostatic hypertrophy)    Frequency of urination    GERD (gastroesophageal reflux disease)     H/O hiatal hernia    History of prostatitis    HX RECURRENT   Hyperlipidemia    Left ureteral calculus    Nonischemic cardiomyopathy (HCC) PER PT ON 11-17-2012 STATED HAS TAKEN HIS COREG FOR A YEAR    LAST LOV TO CARDIOLOGIST  2010-  PCP DR Oliver Barre    Stenosis of both internal carotid arteries    MILD--  0-39%  PER DUPLEX OF 2011   Urgency of urination     Patient Active Problem List   Diagnosis Date Noted   OSA (obstructive sleep apnea) 02/09/2013   Preventative health care 08/31/2012   CAD (coronary artery disease)    Gross hematuria 08/30/2012   Abdominal pain, other specified site 08/30/2012   CAROTID ARTERY DISEASE 04/15/2009   TOBACCO ABUSE 05/07/2008   CARDIOMYOPATHY 04/04/2008   HYPERLIPIDEMIA 03/27/2008   Acute prostatitis 03/27/2008   DIZZINESS 03/27/2008   GERD 06/07/2007   UNSPECIFIED HEPATITIS 06/07/2007   BENIGN PROSTATIC HYPERTROPHY 06/07/2007   COLONIC POLYPS, HX OF 06/07/2007   BARRETT'S ESOPHAGUS, HX OF 06/07/2007    Past Surgical History:  Procedure Laterality Date   CARDIAC CATHETERIZATION  05-15-2008   MILD GLOBAL HYPOKINESIS, WORSE INFERIORLY/  MILD DILATED LVSF/  EF 40-45%   CARDIOVASCULAR STRESS TEST  04-03-2008   LOW RISK NUCLEAR STUDY/ EF 44%/ MILD HYPOKINESIS/ NO ISCHEMIA/  FIXED INFERIOR DEFECT (COULD BE ATTENUATION)   CYSTOSCOPY WITH RETROGRADE PYELOGRAM, URETEROSCOPY AND STENT PLACEMENT  Left 11/21/2012   Procedure: CYSTOSCOPY WITH RETROGRADE PYELOGRAM, URETEROSCOPY AND STENT PLACEMENT;  Surgeon: Lindaann SloughMarc-Henry Nesi, MD;  [facility];  Service: Urology;  Laterality: Left;   HOLMIUM LASER APPLICATION Left 11/21/2012   Procedure: HOLMIUM LASER APPLICATION;  Surgeon: Lindaann SloughMarc-Henry Nesi, MD;  [facility];  Service: Urology;  Laterality: Left;   INGUINAL HERNIA REPAIR Right 1997   TRANSTHORACIC ECHOCARDIOGRAM  04-03-2008   EF 45-50%/  MILD DIFFUSE LV HYPOKINESIS/  NO VALVE ABNORMALITIES        Home Medications    Prior to Admission medications   Medication Sig Start Date End Date Taking? Authorizing Provider  cephALEXin (KEFLEX) 500 MG capsule Take 1 capsule (500 mg total) by mouth 4 (four) times daily for 7 days. 10/11/20 10/18/20 Yes Theadora RamaMorgan, Izumi Mixon Scales, PA-C  aspirin 81 MG tablet Take 81 mg by mouth daily.      [provider]  Omega-3 Fatty Acids (FISH OIL) 500 MG CAPS Take 1 capsule by mouth every other day.    [provider]  omeprazole (PRILOSEC) 20 MG capsule Take 20 mg by mouth every evening.     [provider]  tadalafil (CIALIS) 10 MG tablet Take 10 mg by mouth daily as needed for erectile dysfunction.    [provider]    Family History Family History  Problem Relation Age of Onset   Heart disease Father        deceased-- CHF   Colon polyps Neg Hx    Colon cancer Neg Hx    Rectal cancer Neg Hx    Stomach cancer Neg Hx     Social History Social History   Tobacco Use   Smoking status: Every Day    Packs/day: 1.00    Years: 42.00    Pack years: 42.00    Types: Cigarettes   Smokeless tobacco: Never  Substance Use Topics   Alcohol use: Yes    Alcohol/week: 12.0 standard drinks    Types: 12 Cans of beer per week   Drug use: No     Allergies   Patient has no known allergies.   Review of Systems Review of Systems Per HPI  Physical Exam Triage Vital Signs ED Triage Vitals  Enc Vitals Group     BP      Pulse      Resp      Temp      Temp src      SpO2      Weight      Height      Head Circumference      Peak Flow      Pain Score      Pain Loc      Pain Edu?      Excl. in GC?    No data found.  Updated Vital Signs BP 133/75   Pulse 89   Temp 98.5 F (36.9 C) (Oral)   Resp 16   SpO2 97%   Visual Acuity Right Eye Distance:   Left Eye Distance:   Bilateral Distance:    Right Eye Near:   Left Eye Near:    Bilateral Near:     Physical Exam Exam conducted with a chaperone  present.  Constitutional:      Appearance: Normal appearance.  HENT:     Head: Normocephalic and atraumatic.  Cardiovascular:     Rate and Rhythm: Normal rate and regular rhythm.     Heart sounds: Normal heart  sounds.  Pulmonary:     Effort: Pulmonary effort is normal.     Breath sounds: Normal breath sounds.  Abdominal:     Hernia: There is no hernia in the left inguinal area or right inguinal area.  Genitourinary:    Pubic Area: No rash or pubic lice.      Penis: Normal and circumcised. No phimosis, paraphimosis, hypospadias, erythema, tenderness, discharge, swelling or lesions.      Testes: Normal. Cremasteric reflex is present.        Right: Mass, tenderness, swelling, testicular hydrocele or varicocele not present. Right testis is descended.        Left: Mass, tenderness, swelling, testicular hydrocele or varicocele not present. Left testis is descended. Cremasteric reflex is present.      Epididymis:     Right: Normal.     Left: Normal.     Tanner stage (genital): 5.  Lymphadenopathy:     Lower Body: No right inguinal adenopathy. No left inguinal adenopathy.  Skin:    General: Skin is warm and dry.  Neurological:     General: No focal deficit present.     Mental Status: He is alert and oriented to person, place, and time.  Psychiatric:        Mood and Affect: Mood normal.        Behavior: Behavior normal.     UC Treatments / Results  Labs (all labs ordered are listed, but only abnormal results are displayed) Labs Reviewed  POCT URINALYSIS DIPSTICK, ED / UC - Abnormal; Notable for the following components:      Result Value   Ketones, ur 40 (*)    All other components within normal limits  URINE CULTURE  RPR  HIV ANTIBODY (ROUTINE TESTING W REFLEX)  CBC WITH DIFFERENTIAL/PLATELET  CYTOLOGY, (ORAL, ANAL, URETHRAL) ANCILLARY ONLY    EKG   Radiology No results found.  Procedures Procedures (including critical care time)  Medications Ordered in  UC Medications - No data to display  Initial Impression / Assessment and Plan / UC Course  I have reviewed the triage vital signs and the nursing notes.  Pertinent labs & imaging results that were available during my care of the patient were reviewed by me and considered in my medical decision making (see chart for details).  Clinical Course as of 10/11/20 1314  Sat Oct 11, 2020  1211 POC Urinalysis dipstick(!) [LM]    Clinical Course User Index [LM] Theadora Rama Scales, PA-C    Urethritis of an unknown origin.  CBC was ordered today in addition to HIV and syphilis testing to look for any signs of eosinophilia.  Cytology was repeated although I do not believe that it will reveal any findings.  I also repeat a urinalysis with a concomitant urine culture to rule out any atypical infections.  Plan of care was discussed with patient who verbalized agreement.  Patient given strict return precautions should his symptoms significantly worsen or should he develop any fever. Final Clinical Impressions(s) / UC Diagnoses   Final diagnoses:  Urethritis     Discharge Instructions      Mr. Rotan, it was a pleasure to meet you today.  My top diagnosis is urethritis which is a nonspecific diagnosis meaning inflammation of the urethra.  As we discussed, your previous test results were all negative, there was no concern for any sort of infection.  As we discussed also discussed today, I will prescribe for you a more broad-spectrum antibiotic to cover  you for possible other underlying causes of inflammation of your urethra.  Please be sure you keep your appointment with urology for further evaluation which may include a cystoscopy, a camera placed inside the penis up into the bladder to look for other causes of your pain.     ED Prescriptions     Medication Sig Dispense Auth. Provider   cephALEXin (KEFLEX) 500 MG capsule Take 1 capsule (500 mg total) by mouth 4 (four) times daily for 7 days. 28  capsule Theadora Rama Scales, PA-C      PDMP not reviewed this encounter.   Theadora Rama Scales, PA-C 10/11/20 1205    Theadora Rama Scales, PA-C 10/11/20 1206    Theadora Rama Scales, PA-C 10/11/20 1314

## 2020-10-12 LAB — URINE CULTURE: Culture: NO GROWTH

## 2020-10-12 LAB — RPR: RPR Ser Ql: NONREACTIVE

## 2020-10-13 LAB — CYTOLOGY, (ORAL, ANAL, URETHRAL) ANCILLARY ONLY
Chlamydia: NEGATIVE
Comment: NEGATIVE
Comment: NEGATIVE
Comment: NORMAL
Neisseria Gonorrhea: NEGATIVE
Trichomonas: NEGATIVE

## 2020-10-28 ENCOUNTER — Encounter: Payer: Self-pay | Admitting: Urology

## 2020-10-28 ENCOUNTER — Other Ambulatory Visit: Payer: Self-pay

## 2020-10-28 ENCOUNTER — Ambulatory Visit (INDEPENDENT_AMBULATORY_CARE_PROVIDER_SITE_OTHER): Payer: 59 | Admitting: Urology

## 2020-10-28 VITALS — BP 120/72 | HR 46 | Ht 69.0 in | Wt 210.0 lb

## 2020-10-28 DIAGNOSIS — N411 Chronic prostatitis: Secondary | ICD-10-CM | POA: Insufficient documentation

## 2020-10-28 LAB — URINALYSIS, ROUTINE W REFLEX MICROSCOPIC
Bilirubin, UA: NEGATIVE
Glucose, UA: NEGATIVE
Ketones, UA: NEGATIVE
Leukocytes,UA: NEGATIVE
Nitrite, UA: NEGATIVE
Protein,UA: NEGATIVE
RBC, UA: NEGATIVE
Specific Gravity, UA: 1.01 (ref 1.005–1.030)
Urobilinogen, Ur: 0.2 mg/dL (ref 0.2–1.0)
pH, UA: 5 (ref 5.0–7.5)

## 2020-10-28 LAB — BLADDER SCAN AMB NON-IMAGING: Scan Result: 79

## 2020-10-28 MED ORDER — SULFAMETHOXAZOLE-TRIMETHOPRIM 800-160 MG PO TABS: 1.0000 | ORAL_TABLET | Freq: Two times a day (BID) | ORAL | 0 refills | Status: AC

## 2020-10-28 NOTE — Progress Notes (Signed)
error 

## 2020-10-28 NOTE — Progress Notes (Signed)
Urological Symptom Review  Patient is experiencing the following symptoms: Burning/pain with urination Get up at night to urinate Urinary tract infection Penile pain   Review of Systems  Gastrointestinal (upper)  : Indigestion/heartburn  Gastrointestinal (lower) : Negative for lower GI symptoms  Constitutional : Negative for symptoms  Skin: Itching  Eyes: Negative for eye symptoms  Ear/Nose/Throat : Sinus problems  Hematologic/Lymphatic: Negative for Hematologic/Lymphatic symptoms  Cardiovascular : Negative for cardiovascular symptoms  Respiratory : Cough  Endocrine: Negative for endocrine symptoms  Musculoskeletal: Joint pain  Neurological: Negative for neurological symptoms  Psychologic: Negative for psychiatric symptoms

## 2020-10-28 NOTE — Progress Notes (Signed)
Assessment: 1. Chronic prostatitis     Plan: Diagnosis and management of prostatitis discussed with the patient today. Recommend treatment with Bactrim DS twice daily x20 days.  Prescription sent.  Use and side effects discussed. Return to office in 4 to 6 weeks for follow-up.  Chief Complaint:  Chief Complaint  Patient presents with   Dysuria    History of Present Illness:  Jeffery Frye is a 65 y.o. year old male who is seen for evaluation of dysuria.  He noted onset of some penile pain after voiding approximately 1 month ago.  He also had some discomfort in the perineal area.  This was worsened with caffeine and alcohol intake.  He also has noted some urinary symptoms including frequency, urgency, intermittent stream, and sensation of incomplete emptying.  No gross hematuria or flank pain.  No fevers or chills.  He was initially treated with doxycycline but discontinued this due to worsening of his symptoms.  He recently completed a course of cephalexin with some slight improvement in his symptoms.  He denies any pain with ejaculation.  No scrotal pain or swelling.  His STD evaluation was negative. AUA score = 20 today.   Past Medical History:  Past Medical History:  Diagnosis Date   Arthritis    Barrett esophagus    BPH (benign prostatic hypertrophy)    Frequency of urination    GERD (gastroesophageal reflux disease)    H/O hiatal hernia    History of prostatitis    HX RECURRENT   Hyperlipidemia    Left ureteral calculus    Nonischemic cardiomyopathy (HCC) PER PT ON 11-17-2012 STATED HAS TAKEN HIS COREG FOR A YEAR    LAST LOV TO CARDIOLOGIST  2010-  PCP DR Oliver Barre    Stenosis of both internal carotid arteries    MILD--  0-39%  PER DUPLEX OF 2011   Urgency of urination     Past Surgical History:  Past Surgical History:  Procedure Laterality Date   CARDIAC CATHETERIZATION  05-15-2008   MILD GLOBAL HYPOKINESIS, WORSE INFERIORLY/  MILD DILATED LVSF/  EF 40-45%    CARDIOVASCULAR STRESS TEST  04-03-2008   LOW RISK NUCLEAR STUDY/ EF 44%/ MILD HYPOKINESIS/ NO ISCHEMIA/  FIXED INFERIOR DEFECT (COULD BE ATTENUATION)   CYSTOSCOPY WITH RETROGRADE PYELOGRAM, URETEROSCOPY AND STENT PLACEMENT Left 11/21/2012   Procedure: CYSTOSCOPY WITH RETROGRADE PYELOGRAM, URETEROSCOPY AND STENT PLACEMENT;  Surgeon: Lindaann Slough, MD;  Location: [facility];  Service: Urology;  Laterality: Left;   HOLMIUM LASER APPLICATION Left 11/21/2012   Procedure: HOLMIUM LASER APPLICATION;  Surgeon: Lindaann Slough, MD;  Location: [facility];  Service: Urology;  Laterality: Left;   INGUINAL HERNIA REPAIR Right 1997   TRANSTHORACIC ECHOCARDIOGRAM  04-03-2008   EF 45-50%/  MILD DIFFUSE LV HYPOKINESIS/  NO VALVE ABNORMALITIES    Allergies:  No Known Allergies  Family History:  Family History  Problem Relation Age of Onset   Heart disease Father        deceased-- CHF   Colon polyps Neg Hx    Colon cancer Neg Hx    Rectal cancer Neg Hx    Stomach cancer Neg Hx     Social History:  Social History   Tobacco Use   Smoking status: Every Day    Packs/day: 1.00    Years: 42.00    Pack years: 42.00    Types: Cigarettes   Smokeless tobacco: Never  Substance Use Topics   Alcohol use: Yes  Alcohol/week: 12.0 standard drinks    Types: 12 Cans of beer per week   Drug use: No    Review of symptoms:  Constitutional:  Negative for unexplained weight loss, night sweats, fever, chills ENT:  Negative for nose bleeds, sinus pain, painful swallowing CV:  Negative for chest pain, shortness of breath, exercise intolerance, palpitations, loss of consciousness Resp:  Negative for cough, wheezing, shortness of breath GI:  Negative for nausea, vomiting, diarrhea, bloody stools GU:  Positives noted in HPI; otherwise negative for gross hematuria, urinary incontinence Neuro:  Negative for seizures, poor balance, limb weakness, slurred speech Psych:  Negative for  lack of energy, depression, anxiety Endocrine:  Negative for polydipsia, polyuria, symptoms of hypoglycemia (dizziness, hunger, sweating) Hematologic:  Negative for anemia, purpura, petechia, prolonged or excessive bleeding, use of anticoagulants  Allergic:  Negative for difficulty breathing or choking as a result of exposure to anything; no shellfish allergy; no allergic response (rash/itch) to materials, foods  Physical exam: BP 120/72   Pulse (!) 46   Ht 5\' 9"  (1.753 m)   Wt 210 lb (95.3 kg)   BMI 31.01 kg/m  GENERAL APPEARANCE:  Well appearing, well developed, well nourished, NAD HEENT: Atraumatic, Normocephalic, oropharynx clear. NECK: Supple without lymphadenopathy or thyromegaly. LUNGS: Clear to auscultation bilaterally. HEART: Regular Rate and Rhythm without murmurs, gallops, or rubs. ABDOMEN: Soft, non-tender, No Masses. EXTREMITIES: Moves all extremities well.  Without clubbing, cyanosis, or edema. NEUROLOGIC:  Alert and oriented x 3, normal gait, CN II-XII grossly intact.  MENTAL STATUS:  Appropriate. BACK:  Non-tender to palpation.  No CVAT SKIN:  Warm, dry and intact.  GU: Penis:  uncircumcised Meatus: Normal Scrotum: normal, no masses Testis: normal without masses bilateral Epididymis: normal Prostate: 40 g, tender, no nodules Rectum: Normal tone, normal prostate, no masses or tenderness    Results: Results for orders placed or performed in visit on 10/28/20 (from the past 24 hour(s))  Urinalysis, Routine w reflex microscopic   Collection Time: 10/28/20  2:43 PM  Result Value Ref Range   Specific Gravity, UA 1.010 1.005 - 1.030   pH, UA 5.0 5.0 - 7.5   Color, UA Yellow Yellow   Appearance Ur Clear Clear   Leukocytes,UA Negative Negative   Protein,UA Negative Negative/Trace   Glucose, UA Negative Negative   Ketones, UA Negative Negative   RBC, UA Negative Negative   Bilirubin, UA Negative Negative   Urobilinogen, Ur 0.2 0.2 - 1.0 mg/dL   Nitrite, UA  Negative Negative   Microscopic Examination Comment   BLADDER SCAN AMB NON-IMAGING   Collection Time: 10/28/20  3:07 PM  Result Value Ref Range   Scan Result 79

## 2020-10-28 NOTE — Patient Instructions (Signed)

## 2020-10-28 NOTE — Progress Notes (Signed)
post void residual=79 

## 2020-12-10 ENCOUNTER — Ambulatory Visit: Payer: 59 | Admitting: Urology

## 2020-12-16 ENCOUNTER — Ambulatory Visit: Payer: 59 | Admitting: Urology

## 2020-12-24 ENCOUNTER — Ambulatory Visit: Payer: 59 | Admitting: Urology

## 2020-12-24 DIAGNOSIS — R310 Gross hematuria: Secondary | ICD-10-CM

## 2020-12-24 NOTE — Progress Notes (Deleted)
Assessment: 1. Chronic prostatitis     Plan: Diagnosis and management of prostatitis discussed with the patient today. Recommend treatment with Bactrim DS twice daily x20 days.  Prescription sent.  Use and side effects discussed. Return to office in 4 to 6 weeks for follow-up.  Chief Complaint:  No chief complaint on file.   History of Present Illness:  Jeffery Frye is a 65 y.o. year old male who is seen for further evaluation of dysuria.  At his initial visit in 9/22, he noted onset of some penile pain after voiding approximately 1 month prior.  He also had some discomfort in the perineal area.  This was worsened with caffeine and alcohol intake.  He also noted some urinary symptoms including frequency, urgency, intermittent stream, and sensation of incomplete emptying.  No gross hematuria or flank pain.  No fevers or chills.  He was initially treated with doxycycline but discontinued this due to worsening of his symptoms.  He completed a course of cephalexin with some slight improvement in his symptoms.  He denies any pain with ejaculation.  No scrotal pain or swelling.  His STD evaluation was negative. AUA score = 20. He was treated with Bactrim x20 days for prostatitis.  Portions of the above documentation were copied from a prior visit for review purposes only.   Past Medical History:  Past Medical History:  Diagnosis Date   Arthritis    Barrett esophagus    BPH (benign prostatic hypertrophy)    Frequency of urination    GERD (gastroesophageal reflux disease)    H/O hiatal hernia    History of prostatitis    HX RECURRENT   Hyperlipidemia    Left ureteral calculus    Nonischemic cardiomyopathy (HCC) PER PT ON 11-17-2012 STATED HAS TAKEN HIS COREG FOR A YEAR    LAST LOV TO CARDIOLOGIST  2010-  PCP DR Oliver Barre    Stenosis of both internal carotid arteries    MILD--  0-39%  PER DUPLEX OF 2011   Urgency of urination     Past Surgical History:  Past Surgical History:   Procedure Laterality Date   CARDIAC CATHETERIZATION  05-15-2008   MILD GLOBAL HYPOKINESIS, WORSE INFERIORLY/  MILD DILATED LVSF/  EF 40-45%   CARDIOVASCULAR STRESS TEST  04-03-2008   LOW RISK NUCLEAR STUDY/ EF 44%/ MILD HYPOKINESIS/ NO ISCHEMIA/  FIXED INFERIOR DEFECT (COULD BE ATTENUATION)   CYSTOSCOPY WITH RETROGRADE PYELOGRAM, URETEROSCOPY AND STENT PLACEMENT Left 11/21/2012   Procedure: CYSTOSCOPY WITH RETROGRADE PYELOGRAM, URETEROSCOPY AND STENT PLACEMENT;  Surgeon: Lindaann Slough, MD;  Location: [facility] [place];  Service: Urology;  Laterality: Left;   HOLMIUM LASER APPLICATION Left 11/21/2012   Procedure: HOLMIUM LASER APPLICATION;  Surgeon: Lindaann Slough, MD;  Location: [facility];  Service: Urology;  Laterality: Left;   INGUINAL HERNIA REPAIR Right 1997   TRANSTHORACIC ECHOCARDIOGRAM  04-03-2008   EF 45-50%/  MILD DIFFUSE LV HYPOKINESIS/  NO VALVE ABNORMALITIES    Allergies:  No Known Allergies  Family History:  Family History  Problem Relation Age of Onset   Heart disease Father        deceased-- CHF   Colon polyps Neg Hx    Colon cancer Neg Hx    Rectal cancer Neg Hx    Stomach cancer Neg Hx     Social History:  Social History   Tobacco Use   Smoking status: Every Day    Packs/day: 1.00    Years: 42.00  Pack years: 42.00    Types: Cigarettes   Smokeless tobacco: Never  Substance Use Topics   Alcohol use: Yes    Alcohol/week: 12.0 standard drinks    Types: 12 Cans of beer per week   Drug use: No    ROS: Constitutional:  Negative for fever, chills, weight loss CV: Negative for chest pain, previous MI, hypertension Respiratory:  Negative for shortness of breath, wheezing, sleep apnea, frequent cough GI:  Negative for nausea, vomiting, bloody stool, GERD  Physical exam: There were no vitals taken for this visit. ***  Results: No results found for this or any previous visit (from the past 24 hour(s)).

## 2021-01-02 ENCOUNTER — Ambulatory Visit: Payer: 59 | Admitting: Urology

## 2021-01-02 ENCOUNTER — Other Ambulatory Visit: Payer: Self-pay

## 2021-01-02 ENCOUNTER — Encounter: Payer: Self-pay | Admitting: Urology

## 2021-01-02 VITALS — BP 138/81 | HR 47 | Wt 206.8 lb

## 2021-01-02 DIAGNOSIS — N411 Chronic prostatitis: Secondary | ICD-10-CM | POA: Diagnosis not present

## 2021-01-02 DIAGNOSIS — N401 Enlarged prostate with lower urinary tract symptoms: Secondary | ICD-10-CM | POA: Diagnosis not present

## 2021-01-02 DIAGNOSIS — N138 Other obstructive and reflux uropathy: Secondary | ICD-10-CM | POA: Diagnosis not present

## 2021-01-02 DIAGNOSIS — R350 Frequency of micturition: Secondary | ICD-10-CM | POA: Diagnosis not present

## 2021-01-02 LAB — URINALYSIS, ROUTINE W REFLEX MICROSCOPIC
Bilirubin, UA: NEGATIVE
Glucose, UA: NEGATIVE
Ketones, UA: NEGATIVE
Leukocytes,UA: NEGATIVE
Nitrite, UA: NEGATIVE
Protein,UA: NEGATIVE
RBC, UA: NEGATIVE
Specific Gravity, UA: 1.02 (ref 1.005–1.030)
Urobilinogen, Ur: 0.2 mg/dL (ref 0.2–1.0)
pH, UA: 5.5 (ref 5.0–7.5)

## 2021-01-02 LAB — BLADDER SCAN AMB NON-IMAGING: Scan Result: 1

## 2021-01-02 MED ORDER — ALFUZOSIN HCL ER 10 MG PO TB24: 10.0000 mg | ORAL_TABLET | Freq: Every day | ORAL | 11 refills | Status: DC

## 2021-01-02 NOTE — Progress Notes (Signed)
Assessment: 1. BPH with obstruction/lower urinary tract symptoms   2. Chronic prostatitis   3. Urine frequency      Plan: PSA today Trial of alfuzosin 10 mg daily.  Rx sent. Return to office in 6 weeks  Chief Complaint:  Chief Complaint  Patient presents with   Prostatitis     History of Present Illness:  Jeffery Frye is a 65 y.o. year old male who is seen for further evaluation of dysuria.  At his initial visit in 9/22, he noted onset of some penile pain after voiding approximately 1 month prior.  He also had some discomfort in the perineal area.  This was worsened with caffeine and alcohol intake.  He also noted some urinary symptoms including frequency, urgency, intermittent stream, and sensation of incomplete emptying.  No gross hematuria or flank pain.  No fevers or chills.  He was initially treated with doxycycline but discontinued this due to worsening of his symptoms.  He completed a course of cephalexin with some slight improvement in his symptoms.  He denies any pain with ejaculation.  No scrotal pain or swelling.  His STD evaluation was negative. AUA score = 20. He was treated with Bactrim x 20 days for prostatitis.  He returns today for follow-up.  He completed the Bactrim.  He reports some improvement in his urinary symptoms following the course of antibiotics.  He continues to have urinary frequency, nocturia, intermittent stream, hesitancy, and straining to void.  No current dysuria or gross hematuria. AUA score = 26 today.  Portions of the above documentation were copied from a prior visit for review purposes only.   Past Medical History:  Past Medical History:  Diagnosis Date   Arthritis    Barrett esophagus    BPH (benign prostatic hypertrophy)    Frequency of urination    GERD (gastroesophageal reflux disease)    H/O hiatal hernia    History of prostatitis    HX RECURRENT   Hyperlipidemia    Left ureteral calculus    Nonischemic cardiomyopathy (HCC)  PER PT ON 11-17-2012 STATED HAS TAKEN HIS COREG FOR A YEAR    LAST LOV TO CARDIOLOGIST  2010-  PCP DR Oliver Barre    Stenosis of both internal carotid arteries    MILD--  0-39%  PER DUPLEX OF 2011   Urgency of urination     Past Surgical History:  Past Surgical History:  Procedure Laterality Date   CARDIAC CATHETERIZATION  05-15-2008   MILD GLOBAL HYPOKINESIS, WORSE INFERIORLY/  MILD DILATED LVSF/  EF 40-45%   CARDIOVASCULAR STRESS TEST  04-03-2008   LOW RISK NUCLEAR STUDY/ EF 44%/ MILD HYPOKINESIS/ NO ISCHEMIA/  FIXED INFERIOR DEFECT (COULD BE ATTENUATION)   CYSTOSCOPY WITH RETROGRADE PYELOGRAM, URETEROSCOPY AND STENT PLACEMENT Left 11/21/2012   Procedure: CYSTOSCOPY WITH RETROGRADE PYELOGRAM, URETEROSCOPY AND STENT PLACEMENT;  Surgeon: Lindaann Slough, MD;  Location: [facility];  Service: Urology;  Laterality: Left;   HOLMIUM LASER APPLICATION Left 11/21/2012   Procedure: HOLMIUM LASER APPLICATION;  Surgeon: Lindaann Slough, MD;  Location: [facility] [place];  Service: Urology;  Laterality: Left;   INGUINAL HERNIA REPAIR Right 1997   TRANSTHORACIC ECHOCARDIOGRAM  04-03-2008   EF 45-50%/  MILD DIFFUSE LV HYPOKINESIS/  NO VALVE ABNORMALITIES    Allergies:  No Known Allergies  Family History:  Family History  Problem Relation Age of Onset   Heart disease Father        deceased-- CHF   Colon polyps Neg  Hx    Colon cancer Neg Hx    Rectal cancer Neg Hx    Stomach cancer Neg Hx     Social History:  Social History   Tobacco Use   Smoking status: Every Day    Packs/day: 1.00    Years: 42.00    Pack years: 42.00    Types: Cigarettes   Smokeless tobacco: Never  Substance Use Topics   Alcohol use: Yes    Alcohol/week: 12.0 standard drinks    Types: 12 Cans of beer per week   Drug use: No    ROS: Constitutional:  Negative for fever, chills, weight loss CV: Negative for chest pain, previous MI, hypertension Respiratory:  Negative for shortness of  breath, wheezing, sleep apnea, frequent cough GI:  Negative for nausea, vomiting, bloody stool, GERD  Physical exam: BP 138/81   Pulse (!) 47   Wt 206 lb 12.8 oz (93.8 kg)   BMI 30.54 kg/m  GENERAL APPEARANCE:  Well appearing, well developed, well nourished, NAD HEENT:  Atraumatic, normocephalic, oropharynx clear NECK:  Supple without lymphadenopathy or thyromegaly ABDOMEN:  Soft, non-tender, no masses EXTREMITIES:  Moves all extremities well, without clubbing, cyanosis, or edema NEUROLOGIC:  Alert and oriented x 3, normal gait, CN II-XII grossly intact MENTAL STATUS:  appropriate BACK:  Non-tender to palpation, No CVAT SKIN:  Warm, dry, and intact   Results: U/A:  dipstick negative  Results for orders placed or performed in visit on 01/02/21 (from the past 24 hour(s))  BLADDER SCAN AMB NON-IMAGING   Collection Time: 01/02/21 11:07 AM  Result Value Ref Range   Scan Result 1 ml

## 2021-01-02 NOTE — Progress Notes (Signed)
Urological Symptom Review  Patient is experiencing the following symptoms: Frequent urination Burning/pain with urination Get up at night to urinate Leakage of urine Stream starts and stops Trouble starting stream Urinary tract infection Sexually transmitted disease Painful intercourse Penile pain (male only)    Review of Systems  Gastrointestinal (upper)  : Indigestion/heartburn  Gastrointestinal (lower) : Diarrhea  Constitutional : Fever Night Sweats  Skin: Negative for skin symptoms  Eyes: Negative for eye symptoms  Ear/Nose/Throat : Sinus problems  Hematologic/Lymphatic: Negative for Hematologic/Lymphatic symptoms  Cardiovascular : Negative for cardiovascular symptoms  Respiratory : Negative for respiratory symptoms  Endocrine: Negative for endocrine symptoms  Musculoskeletal: Joint pain  Neurological: Negative for neurological symptoms  Psychologic: Negative for psychiatric symptoms

## 2021-01-03 LAB — PSA: Prostate Specific Ag, Serum: 1.3 ng/mL (ref 0.0–4.0)

## 2021-01-05 NOTE — Progress Notes (Signed)
Sent via mail 

## 2021-02-12 ENCOUNTER — Ambulatory Visit: Payer: 59 | Admitting: Urology

## 2021-02-12 DIAGNOSIS — R350 Frequency of micturition: Secondary | ICD-10-CM

## 2021-02-12 DIAGNOSIS — N138 Other obstructive and reflux uropathy: Secondary | ICD-10-CM

## 2021-02-12 NOTE — Progress Notes (Deleted)
Assessment: 1. BPH with obstruction/lower urinary tract symptoms   2. Urine frequency      Plan: Trial of alfuzosin 10 mg daily.  Rx sent. Return to office in 6 weeks  Chief Complaint:  No chief complaint on file.    History of Present Illness:  Jeffery Frye is a 66 y.o. year old male who is seen for further evaluation of dysuria.  At his initial visit in 9/22, he noted onset of some penile pain after voiding approximately 1 month prior.  He also had some discomfort in the perineal area.  This was worsened with caffeine and alcohol intake.  He also noted some urinary symptoms including frequency, urgency, intermittent stream, and sensation of incomplete emptying.  No gross hematuria or flank pain.  No fevers or chills.  He was initially treated with doxycycline but discontinued this due to worsening of his symptoms.  He completed a course of cephalexin with some slight improvement in his symptoms.  He denies any pain with ejaculation.  No scrotal pain or swelling.  His STD evaluation was negative. AUA score = 20. He was treated with Bactrim x 20 days for prostatitis. He completed the Bactrim and noted some improvement in his urinary symptoms following the course of antibiotics.  He continued to have urinary frequency, nocturia, intermittent stream, hesitancy, and straining to void.  No current dysuria or gross hematuria. AUA score = 26. He was given a trial of alfuzosin in December 2022. PSA 12/22: 1.3  Portions of the above documentation were copied from a prior visit for review purposes only.   Past Medical History:  Past Medical History:  Diagnosis Date   Arthritis    Barrett esophagus    BPH (benign prostatic hypertrophy)    Frequency of urination    GERD (gastroesophageal reflux disease)    H/O hiatal hernia    History of prostatitis    HX RECURRENT   Hyperlipidemia    Left ureteral calculus    Nonischemic cardiomyopathy (HCC) PER PT ON 11-17-2012 STATED HAS TAKEN  HIS COREG FOR A YEAR    LAST LOV TO CARDIOLOGIST  2010-  PCP DR Cathlean Cower    Stenosis of both internal carotid arteries    MILD--  0-39%  PER DUPLEX OF 2011   Urgency of urination     Past Surgical History:  Past Surgical History:  Procedure Laterality Date   CARDIAC CATHETERIZATION  05-15-2008   MILD GLOBAL HYPOKINESIS, WORSE INFERIORLY/  MILD DILATED LVSF/  EF 40-45%   CARDIOVASCULAR STRESS TEST  04-03-2008   LOW RISK NUCLEAR STUDY/ EF 44%/ MILD HYPOKINESIS/ NO ISCHEMIA/  FIXED INFERIOR DEFECT (COULD BE ATTENUATION)   CYSTOSCOPY WITH RETROGRADE PYELOGRAM, URETEROSCOPY AND STENT PLACEMENT Left 11/21/2012   Procedure: CYSTOSCOPY WITH RETROGRADE PYELOGRAM, URETEROSCOPY AND STENT PLACEMENT;  Surgeon: Hanley Ben, MD;  Location: [place];  Service: Urology;  Laterality: Left;   HOLMIUM LASER APPLICATION Left [id number]   Procedure: HOLMIUM LASER APPLICATION;  Surgeon: Hanley Ben, MD;  Location: [place];  Service: Urology;  Laterality: Left;   INGUINAL HERNIA REPAIR Right 1997   TRANSTHORACIC ECHOCARDIOGRAM  04-03-2008   EF 45-50%/  MILD DIFFUSE LV HYPOKINESIS/  NO VALVE ABNORMALITIES    Allergies:  No Known Allergies  Family History:  Family History  Problem Relation Age of Onset   Heart disease Father        deceased-- CHF   Colon polyps Neg Hx    Colon cancer Neg Hx  Rectal cancer Neg Hx    Stomach cancer Neg Hx     Social History:  Social History   Tobacco Use   Smoking status: Every Day    Packs/day: 1.00    Years: 42.00    Pack years: 42.00    Types: Cigarettes   Smokeless tobacco: Never  Substance Use Topics   Alcohol use: Yes    Alcohol/week: 12.0 standard drinks    Types: 12 Cans of beer per week   Drug use: No    ROS: Constitutional:  Negative for fever, chills, weight loss CV: Negative for chest pain, previous MI, hypertension Respiratory:  Negative for shortness of breath, wheezing, sleep apnea, frequent  cough GI:  Negative for nausea, vomiting, bloody stool, GERD  Physical exam: There were no vitals taken for this visit. ***  Results: No results found for this or any previous visit (from the past 24 hour(s)).

## 2021-03-17 ENCOUNTER — Other Ambulatory Visit: Payer: Self-pay

## 2021-03-17 ENCOUNTER — Ambulatory Visit (HOSPITAL_COMMUNITY)
Admission: EM | Admit: 2021-03-17 | Discharge: 2021-03-17 | Disposition: A | Discharge status: Home / Self Care | Payer: 59 | Attending: Family | Admitting: Family

## 2021-03-17 ENCOUNTER — Encounter (HOSPITAL_COMMUNITY): Payer: Self-pay

## 2021-03-17 DIAGNOSIS — R21 Rash and other nonspecific skin eruption: Secondary | ICD-10-CM | POA: Diagnosis not present

## 2021-03-17 MED ORDER — MUPIROCIN CALCIUM 2 % EX CREA: 1.0000 "application " | TOPICAL_CREAM | Freq: Two times a day (BID) | CUTANEOUS | 0 refills | Status: AC

## 2021-03-17 MED ORDER — TRIAMCINOLONE ACETONIDE 0.1 % EX CREA: 1.0000 "application " | TOPICAL_CREAM | Freq: Three times a day (TID) | CUTANEOUS | 0 refills | Status: DC

## 2021-03-17 NOTE — ED Triage Notes (Signed)
Pt presents with rash on right side of chest X 1 week.

## 2021-03-17 NOTE — Discharge Instructions (Addendum)
Recommend apply Bactroban cream to rash/affected area twice a day as directed for 5 to 7 days. May also use Triamcinolone cream- apply up to 3 times a day to affected area to help with redness and itching. Follow-up here in 4 to 5 days if not improving or sooner if worsening.

## 2021-03-17 NOTE — ED Provider Notes (Signed)
[facility]    CSN: [id number] Arrival date & time: 03/17/21  1043      History   Chief Complaint Chief Complaint  Patient presents with   Rash    HPI Jeffery Frye is a 66 y.o. male.   66 year old male presents with a rash present on his right upper chest/neck area for the past 2 days. Spreading slightly to upper neck and mid upper chest. Some itching. Denies any pain. Uncertain if he was bit by an insect. Did drink a new alcoholic beer 2 days ago and uncertain if having a reaction to a substance in the beer. Has rubbed rash with alcohol swab with no relief in symptoms. Has not applied any other topical medication. No other family members with similar rash. Did have 1 dose of Shingles vaccine about 1 year ago. Other chronic health issues include CAD, hyperlipidemia, tobacco use and BPH. Currently on Uroxatral and aspirin daily.   The history is provided by the patient.   Past Medical History:  Diagnosis Date   Arthritis    Barrett esophagus    BPH (benign prostatic hypertrophy)    Frequency of urination    GERD (gastroesophageal reflux disease)    H/O hiatal hernia    History of prostatitis    HX RECURRENT   Hyperlipidemia    Left ureteral calculus    Nonischemic cardiomyopathy ([place]) PER PT ON 11-17-2012 STATED HAS TAKEN HIS COREG FOR A YEAR    LAST LOV TO CARDIOLOGIST  2010-  PCP DR Cathlean Cower    Stenosis of both internal carotid arteries    MILD--  0-39%  PER DUPLEX OF 2011   Urgency of urination     Patient Active Problem List   Diagnosis Date Noted   Chronic prostatitis 10/28/2020   OSA (obstructive sleep apnea) 02/09/2013   Preventative health care 08/31/2012   CAD (coronary artery disease)    Gross hematuria 08/30/2012   Abdominal pain, other specified site 08/30/2012   CAROTID ARTERY DISEASE 04/15/2009   TOBACCO ABUSE 05/07/2008   CARDIOMYOPATHY 04/04/2008   HYPERLIPIDEMIA 03/27/2008   Acute prostatitis 03/27/2008   DIZZINESS 03/27/2008    GERD 06/07/2007   UNSPECIFIED HEPATITIS 06/07/2007   BENIGN PROSTATIC HYPERTROPHY 06/07/2007   COLONIC POLYPS, HX OF 06/07/2007   BARRETT'S ESOPHAGUS, HX OF 06/07/2007    Past Surgical History:  Procedure Laterality Date   CARDIAC CATHETERIZATION  05-15-2008   MILD GLOBAL HYPOKINESIS, WORSE INFERIORLY/  MILD DILATED LVSF/  EF 40-45%   CARDIOVASCULAR STRESS TEST  04-03-2008   LOW RISK NUCLEAR STUDY/ EF 44%/ MILD HYPOKINESIS/ NO ISCHEMIA/  FIXED INFERIOR DEFECT (COULD BE ATTENUATION)   CYSTOSCOPY WITH RETROGRADE PYELOGRAM, URETEROSCOPY AND STENT PLACEMENT Left 11/21/2012   Procedure: CYSTOSCOPY WITH RETROGRADE PYELOGRAM, URETEROSCOPY AND STENT PLACEMENT;  Surgeon: Hanley Ben, MD;  Location: [place];  Service: Urology;  Laterality: Left;   HOLMIUM LASER APPLICATION Left [id number]   Procedure: HOLMIUM LASER APPLICATION;  Surgeon: Hanley Ben, MD;  Location: [place];  Service: Urology;  Laterality: Left;   INGUINAL HERNIA REPAIR Right 1997   TRANSTHORACIC ECHOCARDIOGRAM  04-03-2008   EF 45-50%/  MILD DIFFUSE LV HYPOKINESIS/  NO VALVE ABNORMALITIES       Home Medications    Prior to Admission medications   Medication Sig Start Date End Date Taking? Authorizing Provider  mupirocin cream (BACTROBAN) 2 % Apply 1 application topically 2 (two) times daily for 5 days. 03/17/21 03/22/21 Yes Tajai Ihde,  Ali Lowe, NP  triamcinolone cream (KENALOG) 0.1 % Apply 1 application topically 3 (three) times daily. To affected area as needed 03/17/21  Yes Murdis Flitton, Ali Lowe, NP  alfuzosin (UROXATRAL) 10 MG 24 hr tablet Take 1 tablet (10 mg total) by mouth daily with breakfast. 01/02/21   Stone[place], Danford Bad., MD  aspirin 81 MG tablet Take 81 mg by mouth daily.      [provider]  Omega-3 Fatty Acids (FISH OIL) 500 MG CAPS Take 1 capsule by mouth every other day. Patient not taking: Reported on 10/28/2020    [provider]  tadalafil (CIALIS) 10 MG  tablet Take 10 mg by mouth daily as needed for erectile dysfunction.    [provider]    Family History Family History  Problem Relation Age of Onset   Heart disease Father        deceased-- CHF   Colon polyps Neg Hx    Colon cancer Neg Hx    Rectal cancer Neg Hx    Stomach cancer Neg Hx     Social History Social History   Tobacco Use   Smoking status: Every Day    Packs/day: 1.00    Years: 42.00    Pack years: 42.00    Types: Cigarettes   Smokeless tobacco: Never  Substance Use Topics   Alcohol use: Yes    Alcohol/week: 12.0 standard drinks    Types: 12 Cans of beer per week   Drug use: No     Allergies   Patient has no known allergies.   Review of Systems Review of Systems  Constitutional:  Negative for activity change, appetite change, chills, fatigue and fever.  HENT:  Negative for mouth sores, sore throat and trouble swallowing.   Respiratory:  Negative for chest tightness and shortness of breath.   Genitourinary:  Positive for decreased urine volume and difficulty urinating.  Musculoskeletal:  Positive for arthralgias (chronic).  Skin:  Positive for color change and rash.  Allergic/Immunologic: Negative for environmental allergies and food allergies.  Neurological:  Negative for seizures, syncope, facial asymmetry, speech difficulty, numbness and headaches.  Hematological:  Negative for adenopathy. Does not bruise/bleed easily.    Physical Exam Triage Vital Signs ED Triage Vitals  Enc Vitals Group     BP 03/17/21 1240 123/73     Pulse Rate 03/17/21 1240 80     Resp 03/17/21 1240 17     Temp 03/17/21 1240 98.3 F (36.8 C)     Temp Source 03/17/21 1240 Oral     SpO2 03/17/21 1240 93 %     Weight --      Height --      Head Circumference --      Peak Flow --      Pain Score 03/17/21 1242 0     Pain Loc --      Pain Edu? --      Excl. in GC? --    No data found.  Updated Vital Signs BP 123/73 (BP Location: Left Arm)    Pulse 80     Temp 98.3 F (36.8 C) (Oral)    Resp 17    SpO2 93%   Visual Acuity Right Eye Distance:   Left Eye Distance:   Bilateral Distance:    Right Eye Near:   Left Eye Near:    Bilateral Near:     Physical Exam Vitals and nursing note reviewed.  Constitutional:      General: He is  awake. He is not in acute distress.    Appearance: He is well-developed and well-groomed.     Comments: He is sitting on the exam table in no acute distress. He is not scratching or rubbing rash.   HENT:     Head: Normocephalic and atraumatic.     Right Ear: Hearing normal.     Left Ear: Hearing normal.  Eyes:     Extraocular Movements: Extraocular movements intact.     Conjunctiva/sclera: Conjunctivae normal.  Cardiovascular:     Rate and Rhythm: Normal rate.  Pulmonary:     Effort: Pulmonary effort is normal.  Chest:     Chest wall: No lacerations, swelling or tenderness.       Comments: Pink maculopapular lesions present in a few clusters on right upper chest and up towards neck. No fluid filled lesions. Minimally tender to palpation. Minimal itch. No discharge or crusting. One lesion just left of midline along clavicular line. No other lesions present. No underlying erythema.  Musculoskeletal:        General: Normal range of motion.     Cervical back: Normal range of motion and neck supple.  Skin:    General: Skin is warm and dry.     Capillary Refill: Capillary refill takes less than 2 seconds.     Findings: Rash present. No abrasion, abscess, bruising, ecchymosis, erythema, petechiae or wound. Rash is macular and papular. Rash is not crusting, nodular, purpuric, pustular, scaling or vesicular.  Neurological:     General: No focal deficit present.     Mental Status: He is alert and oriented to person, place, and time.  Psychiatric:        Mood and Affect: Mood normal.        Behavior: Behavior normal. Behavior is cooperative.        Thought Content: Thought content normal.        Judgment:  Judgment normal.     UC Treatments / Results  Labs (all labs ordered are listed, but only abnormal results are displayed) Labs Reviewed - No data to display  EKG   Radiology No results found.  Procedures Procedures (including critical care time)  Medications Ordered in UC Medications - No data to display  Initial Impression / Assessment and Plan / UC Course  I have reviewed the triage vital signs and the nursing notes.  Pertinent labs & imaging results that were available during my care of the patient were reviewed by me and considered in my medical decision making (see chart for details).     Reviewed with patient- uncertain of exact etiology of rash- doubt shingles since no pain and minimally tender and one lesion has crossed midline. Possible allergic dermatitis from food/drink substance. May also be a contact dermatitis. Doubt insect bite since multiple lesions and no distinct pattern. Will treat with Triamcinolone cream- apply 2 to 3 times a day to affected areas. May also apply Bactroban cream twice a day for 5 to 7 days to prevent infection. Follow-up here in 4 to 5 days if not improving or sooner if worsening.  Final Clinical Impressions(s) / UC Diagnoses   Final diagnoses:  Rash and nonspecific skin eruption     Discharge Instructions      Recommend apply Bactroban cream to rash/affected area twice a day as directed for 5 to 7 days. May also use Triamcinolone cream- apply up to 3 times a day to affected area to help with redness and itching. Follow-up here in  4 to 5 days if not improving or sooner if worsening.      ED Prescriptions     Medication Sig Dispense Auth. Provider   triamcinolone cream (KENALOG) 0.1 % Apply 1 application topically 3 (three) times daily. To affected area as needed 15 g Katy Apo, NP   mupirocin cream (BACTROBAN) 2 % Apply 1 application topically 2 (two) times daily for 5 days. 15 g Katy Apo, NP      PDMP not  reviewed this encounter.   Katy Apo, NP 03/18/21 2104

## 2021-03-20 ENCOUNTER — Other Ambulatory Visit: Payer: Self-pay

## 2021-03-20 ENCOUNTER — Ambulatory Visit: Payer: 59 | Admitting: Urology

## 2021-03-20 ENCOUNTER — Encounter: Payer: Self-pay | Admitting: Urology

## 2021-03-20 VITALS — BP 111/74 | HR 105 | Wt 206.0 lb

## 2021-03-20 DIAGNOSIS — N138 Other obstructive and reflux uropathy: Secondary | ICD-10-CM

## 2021-03-20 DIAGNOSIS — N401 Enlarged prostate with lower urinary tract symptoms: Secondary | ICD-10-CM

## 2021-03-20 DIAGNOSIS — N411 Chronic prostatitis: Secondary | ICD-10-CM

## 2021-03-20 LAB — URINALYSIS, ROUTINE W REFLEX MICROSCOPIC
Bilirubin, UA: NEGATIVE
Glucose, UA: NEGATIVE
Leukocytes,UA: NEGATIVE
Nitrite, UA: NEGATIVE
Protein,UA: NEGATIVE
RBC, UA: NEGATIVE
Specific Gravity, UA: 1.025 (ref 1.005–1.030)
Urobilinogen, Ur: 1 mg/dL (ref 0.2–1.0)
pH, UA: 7 (ref 5.0–7.5)

## 2021-03-20 MED ORDER — MIRABEGRON ER 25 MG PO TB24: 25.0000 mg | ORAL_TABLET | Freq: Every day | ORAL | 0 refills | Status: DC

## 2021-03-20 NOTE — Progress Notes (Signed)
Assessment: 1. BPH with obstruction/lower urinary tract symptoms   2. Chronic prostatitis    Plan: Continue alfuzosin 10 mg daily.   Trial of Myrbetriq 25 mg daily.  Samples given. Return to office in 1 month  Chief Complaint:  Chief Complaint  Patient presents with   Benign Prostatic Hypertrophy     History of Present Illness:  Jeffery Frye is a 66 y.o. year old male who is seen for further evaluation of dysuria.  At his initial visit in 9/22, he noted onset of some penile pain after voiding approximately 1 month prior.  He also had some discomfort in the perineal area.  This was worsened with caffeine and alcohol intake.  He also noted some urinary symptoms including frequency, urgency, intermittent stream, and sensation of incomplete emptying.  No gross hematuria or flank pain.  No fevers or chills.  He was initially treated with doxycycline but discontinued this due to worsening of his symptoms.  He completed a course of cephalexin with some slight improvement in his symptoms.  He denies any pain with ejaculation.  No scrotal pain or swelling.  His STD evaluation was negative. AUA score = 20. He was treated with Bactrim x 20 days for prostatitis.  At the time of his visit in 12/22, he had completed the Bactrim.  He reported some improvement in his urinary symptoms following the course of antibiotics.  He continued to have urinary frequency, nocturia, intermittent stream, hesitancy, and straining to void.  No current dysuria or gross hematuria. AUA score = 26. PVR = 1 mL. PSA 12/22: 1.3 He was given a trial of alfuzosin 10 mg daily.   He returns today for follow-up.  He continues on alfuzosin 10 mg daily.  He continues to have lower urinary tract symptoms including frequency, urgency, intermittent stream, decreased force of stream, and sensation of incomplete emptying.  No dysuria or gross hematuria. IPSS = 26 today. He uses tadalafil 20 mg as needed for erectile  dysfunction.  Portions of the above documentation were copied from a prior visit for review purposes only.   Past Medical History:  Past Medical History:  Diagnosis Date   Arthritis    Barrett esophagus    BPH (benign prostatic hypertrophy)    Frequency of urination    GERD (gastroesophageal reflux disease)    H/O hiatal hernia    History of prostatitis    HX RECURRENT   Hyperlipidemia    Left ureteral calculus    Nonischemic cardiomyopathy (HCC) PER PT ON 11-17-2012 STATED HAS TAKEN HIS COREG FOR A YEAR    LAST LOV TO CARDIOLOGIST  2010-  PCP DR Oliver Barre    Stenosis of both internal carotid arteries    MILD--  0-39%  PER DUPLEX OF 2011   Urgency of urination     Past Surgical History:  Past Surgical History:  Procedure Laterality Date   CARDIAC CATHETERIZATION  05-15-2008   MILD GLOBAL HYPOKINESIS, WORSE INFERIORLY/  MILD DILATED LVSF/  EF 40-45%   CARDIOVASCULAR STRESS TEST  04-03-2008   LOW RISK NUCLEAR STUDY/ EF 44%/ MILD HYPOKINESIS/ NO ISCHEMIA/  FIXED INFERIOR DEFECT (COULD BE ATTENUATION)   CYSTOSCOPY WITH RETROGRADE PYELOGRAM, URETEROSCOPY AND STENT PLACEMENT Left 11/21/2012   Procedure: CYSTOSCOPY WITH RETROGRADE PYELOGRAM, URETEROSCOPY AND STENT PLACEMENT;  Surgeon: Lindaann Slough, MD;  Location: [facility];  Service: Urology;  Laterality: Left;   HOLMIUM LASER APPLICATION Left 11/21/2012   Procedure: HOLMIUM LASER APPLICATION;  Surgeon: Lindaann Slough, MD;  Location: [place];  Service: Urology;  Laterality: Left;   INGUINAL HERNIA REPAIR Right 1997   TRANSTHORACIC ECHOCARDIOGRAM  04-03-2008   EF 45-50%/  MILD DIFFUSE LV HYPOKINESIS/  NO VALVE ABNORMALITIES    Allergies:  No Known Allergies  Family History:  Family History  Problem Relation Age of Onset   Heart disease Father        deceased-- CHF   Colon polyps Neg Hx    Colon cancer Neg Hx    Rectal cancer Neg Hx    Stomach cancer Neg Hx     Social History:   Social History   Tobacco Use   Smoking status: Every Day    Packs/day: 1.00    Years: 42.00    Pack years: 42.00    Types: Cigarettes   Smokeless tobacco: Never  Substance Use Topics   Alcohol use: Yes    Alcohol/week: 12.0 standard drinks    Types: 12 Cans of beer per week   Drug use: No    ROS: Constitutional:  Negative for fever, chills, weight loss CV: Negative for chest pain, previous MI, hypertension Respiratory:  Negative for shortness of breath, wheezing, sleep apnea, frequent cough GI:  Negative for nausea, vomiting, bloody stool, GERD  Physical exam: BP 111/74 (BP Location: Left Arm)    Pulse (!) 105    Wt 206 lb (93.4 kg)    BMI 30.42 kg/m  GENERAL APPEARANCE:  Well appearing, well developed, well nourished, NAD HEENT:  Atraumatic, normocephalic, oropharynx clear NECK:  Supple without lymphadenopathy or thyromegaly ABDOMEN:  Soft, non-tender, no masses EXTREMITIES:  Moves all extremities well, without clubbing, cyanosis, or edema NEUROLOGIC:  Alert and oriented x 3, normal gait, CN II-XII grossly intact MENTAL STATUS:  appropriate BACK:  Non-tender to palpation, No CVAT SKIN:  Warm, dry, and intact   Results: U/A dipstick negative  PVR:  8 ml

## 2021-04-16 ENCOUNTER — Ambulatory Visit: Payer: 59 | Admitting: Urology

## 2021-04-16 ENCOUNTER — Other Ambulatory Visit: Payer: Self-pay

## 2021-04-16 ENCOUNTER — Encounter: Payer: Self-pay | Admitting: Urology

## 2021-04-16 VITALS — BP 114/74 | HR 83

## 2021-04-16 DIAGNOSIS — N401 Enlarged prostate with lower urinary tract symptoms: Secondary | ICD-10-CM | POA: Diagnosis not present

## 2021-04-16 DIAGNOSIS — R350 Frequency of micturition: Secondary | ICD-10-CM | POA: Diagnosis not present

## 2021-04-16 DIAGNOSIS — N411 Chronic prostatitis: Secondary | ICD-10-CM | POA: Diagnosis not present

## 2021-04-16 DIAGNOSIS — N138 Other obstructive and reflux uropathy: Secondary | ICD-10-CM | POA: Diagnosis not present

## 2021-04-16 LAB — URINALYSIS, ROUTINE W REFLEX MICROSCOPIC
Bilirubin, UA: NEGATIVE
Glucose, UA: NEGATIVE
Ketones, UA: NEGATIVE
Leukocytes,UA: NEGATIVE
Nitrite, UA: NEGATIVE
Protein,UA: NEGATIVE
RBC, UA: NEGATIVE
Specific Gravity, UA: 1.025 (ref 1.005–1.030)
Urobilinogen, Ur: 0.2 mg/dL (ref 0.2–1.0)
pH, UA: 5.5 (ref 5.0–7.5)

## 2021-04-16 LAB — BLADDER SCAN AMB NON-IMAGING: Scan Result: 17

## 2021-04-16 MED ORDER — MIRABEGRON ER 25 MG PO TB24: 25.0000 mg | ORAL_TABLET | Freq: Every day | ORAL | 11 refills | Status: DC

## 2021-04-16 NOTE — Progress Notes (Signed)
post void residual=17 

## 2021-04-16 NOTE — Progress Notes (Signed)
Assessment: ?1. BPH with obstruction/lower urinary tract symptoms   ?2. Chronic prostatitis   ?3. Urine frequency   ? ? ?Plan: ?Continue alfuzosin 10 mg daily.   ?Continue Myrbetriq 25 mg daily.  Samples and rx given. ?Bladder diet instructions given ?Return to office in 3 months ? ?Chief Complaint:  ?Chief Complaint  ?Patient presents with  ? Benign Prostatic Hypertrophy  ? ? ? ?History of Present Illness: ? ?Jeffery Frye is a 66 y.o. year old male who is seen for further evaluation of BPH with obstruction and lower urinary tract symptoms.  At his initial visit in 9/22, he noted onset of some penile pain after voiding approximately 1 month prior.  He also had some discomfort in the perineal area.  This was worsened with caffeine and alcohol intake.  He also noted some urinary symptoms including frequency, urgency, intermittent stream, and sensation of incomplete emptying.  No gross hematuria or flank pain.  No fevers or chills.  He was initially treated with doxycycline but discontinued this due to worsening of his symptoms.  He completed a course of cephalexin with some slight improvement in his symptoms.  He denies any pain with ejaculation.  No scrotal pain or swelling.  His STD evaluation was negative. ?AUA score = 20. ?He was treated with Bactrim x 20 days for prostatitis. ? ?At the time of his visit in 12/22, he had completed the Bactrim.  He reported some improvement in his urinary symptoms following the course of antibiotics.  He continued to have urinary frequency, nocturia, intermittent stream, hesitancy, and straining to void.   ?AUA score = 26. ?PVR = 1 mL. ?PSA 12/22: 1.3 ?He was given a trial of alfuzosin 10 mg daily.  ? ?At his visit in 2/23, he continued on alfuzosin 10 mg daily.  He continued to have lower urinary tract symptoms including frequency, urgency, intermittent stream, decreased force of stream, and sensation of incomplete emptying.  No dysuria or gross hematuria. ?IPSS = 26.  PVR = 8  ml. ?He continued on tadalafil 20 mg as needed for erectile dysfunction. ?He was given a trial of Myrbetriq 25 mg daily. ? ?He returns today for follow-up.  He continues on alfuzosin and Myrbetriq.  He continues to report urinary symptoms including frequency, urgency, decreased force of stream, straining, and sensation of incomplete emptying.  He does feel like his urinary symptoms have improved with the addition of Myrbetriq.  He is having decreased frequency and urgency.  No side effects from the medication.  No dysuria or gross hematuria. ?IPSS = 21 today. ? ?Portions of the above documentation were copied from a prior visit for review purposes only. ? ? ?Past Medical History:  ?Past Medical History:  ?Diagnosis Date  ? Arthritis   ? Barrett esophagus   ? BPH (benign prostatic hypertrophy)   ? Frequency of urination   ? GERD (gastroesophageal reflux disease)   ? H/O hiatal hernia   ? History of prostatitis   ? HX RECURRENT  ? Hyperlipidemia   ? Left ureteral calculus   ? Nonischemic cardiomyopathy (HCC) PER PT ON 11-17-2012 STATED HAS TAKEN HIS COREG FOR A YEAR   ? LAST LOV TO CARDIOLOGIST  2010-  PCP DR Oliver Barre   ? Stenosis of both internal carotid arteries   ? MILD--  0-39%  PER DUPLEX OF 2011  ? Urgency of urination   ? ? ?Past Surgical History:  ?Past Surgical History:  ?Procedure Laterality Date  ? CARDIAC CATHETERIZATION  05-15-2008  ? MILD GLOBAL HYPOKINESIS, WORSE INFERIORLY/  MILD DILATED LVSF/  EF 40-45%  ? CARDIOVASCULAR STRESS TEST  04-03-2008  ? LOW RISK NUCLEAR STUDY/ EF 44%/ MILD HYPOKINESIS/ NO ISCHEMIA/  FIXED INFERIOR DEFECT (COULD BE ATTENUATION)  ? CYSTOSCOPY WITH RETROGRADE PYELOGRAM, URETEROSCOPY AND STENT PLACEMENT Left 11/21/2012  ? Procedure: CYSTOSCOPY WITH RETROGRADE PYELOGRAM, URETEROSCOPY AND STENT PLACEMENT;  Surgeon: Lindaann Slough, MD;  Location: [facility] [place];  Service: Urology;  Laterality: Left;  ? HOLMIUM LASER APPLICATION Left 11/21/2012  ? Procedure: HOLMIUM  LASER APPLICATION;  Surgeon: Lindaann Slough, MD;  Location: [facility];  Service: Urology;  Laterality: Left;  ? INGUINAL HERNIA REPAIR Right 1997  ? TRANSTHORACIC ECHOCARDIOGRAM  04-03-2008  ? EF 45-50%/  MILD DIFFUSE LV HYPOKINESIS/  NO VALVE ABNORMALITIES  ? ? ?Allergies:  ?No Known Allergies ? ?Family History:  ?Family History  ?Problem Relation Age of Onset  ? Heart disease Father   ?     deceased-- CHF  ? Colon polyps Neg Hx   ? Colon cancer Neg Hx   ? Rectal cancer Neg Hx   ? Stomach cancer Neg Hx   ? ? ?Social History:  ?Social History  ? ?Tobacco Use  ? Smoking status: Every Day  ?  Packs/day: 1.00  ?  Years: 42.00  ?  Pack years: 42.00  ?  Types: Cigarettes  ? Smokeless tobacco: Never  ?Substance Use Topics  ? Alcohol use: Yes  ?  Alcohol/week: 12.0 standard drinks  ?  Types: 12 Cans of beer per week  ? Drug use: No  ? ? ?ROS: ?Constitutional:  Negative for fever, chills, weight loss ?CV: Negative for chest pain, previous MI, hypertension ?Respiratory:  Negative for shortness of breath, wheezing, sleep apnea, frequent cough ?GI:  Negative for nausea, vomiting, bloody stool, GERD ? ?Physical exam: ?BP 114/74   Pulse 83  ?GENERAL APPEARANCE:  Well appearing, well developed, well nourished, NAD ?HEENT:  Atraumatic, normocephalic, oropharynx clear ?NECK:  Supple without lymphadenopathy or thyromegaly ?ABDOMEN:  Soft, non-tender, no masses ?EXTREMITIES:  Moves all extremities well, without clubbing, cyanosis, or edema ?NEUROLOGIC:  Alert and oriented x 3, normal gait, CN II-XII grossly intact ?MENTAL STATUS:  appropriate ?BACK:  Non-tender to palpation, No CVAT ?SKIN:  Warm, dry, and intact ?  ? ?Results: ?U/A dipstick negative ? ?PVR = 17 ml ?

## 2021-04-22 ENCOUNTER — Ambulatory Visit: Payer: 59 | Admitting: Urology

## 2021-04-27 ENCOUNTER — Other Ambulatory Visit: Payer: Self-pay

## 2021-04-27 ENCOUNTER — Encounter (HOSPITAL_BASED_OUTPATIENT_CLINIC_OR_DEPARTMENT_OTHER): Payer: Self-pay

## 2021-04-27 DIAGNOSIS — L309 Dermatitis, unspecified: Secondary | ICD-10-CM | POA: Insufficient documentation

## 2021-04-27 DIAGNOSIS — F17210 Nicotine dependence, cigarettes, uncomplicated: Secondary | ICD-10-CM | POA: Diagnosis not present

## 2021-04-27 DIAGNOSIS — R21 Rash and other nonspecific skin eruption: Secondary | ICD-10-CM | POA: Diagnosis present

## 2021-04-27 NOTE — ED Triage Notes (Signed)
Patient here POV from Home with Skin Irritation. ? ?Patient began to have a Samll Area of Skin Irritation ad Redness to Right Upper Arm that began Sunday PM. ? ?Patient believes it is a Spider Bite due to Similar Occurrence previously. No Fevers. No N/V/D. ? ?NAD Noted during Triage. A&Ox4. GCS 15. Ambulatory. ?

## 2021-04-28 ENCOUNTER — Emergency Department (HOSPITAL_BASED_OUTPATIENT_CLINIC_OR_DEPARTMENT_OTHER)
Admission: EM | Admit: 2021-04-28 | Discharge: 2021-04-28 | Disposition: A | Discharge status: Home / Self Care | Payer: 59 | Attending: Emergency Medicine | Admitting: Emergency Medicine

## 2021-04-28 DIAGNOSIS — L309 Dermatitis, unspecified: Secondary | ICD-10-CM

## 2021-04-28 NOTE — ED Provider Notes (Signed)
?[facility] ?[facility] Emergency Department ?Provider Note ?MRN:  [id number]  ?Arrival date & time: 04/28/21    ? ?Chief Complaint   ?Skin Irritation ?  ?History of Present Illness   ?Jeffery Frye is a 66 y.o. year-old male with a history of nonischemic cardiomyopathy presenting to the ED with chief complaint of rash. ? ?Red rash to right upper arm, present for the past 24 hours.  Denies nausea vomiting, no shortness of breath, no rash anywhere else. ? ?Review of Systems  ?A thorough review of systems was obtained and all systems are negative except as noted in the HPI and PMH.  ? ?Patient's Health History   ? ?Past Medical History:  ?Diagnosis Date  ? Arthritis   ? Barrett esophagus   ? BPH (benign prostatic hypertrophy)   ? Frequency of urination   ? GERD (gastroesophageal reflux disease)   ? H/O hiatal hernia   ? History of prostatitis   ? HX RECURRENT  ? Hyperlipidemia   ? Left ureteral calculus   ? Nonischemic cardiomyopathy (HCC) PER PT ON 11-17-2012 STATED HAS TAKEN HIS COREG FOR A YEAR   ? LAST LOV TO CARDIOLOGIST  2010-  PCP DR Oliver Barre   ? Stenosis of both internal carotid arteries   ? MILD--  0-39%  PER DUPLEX OF 2011  ? Urgency of urination   ?  ?Past Surgical History:  ?Procedure Laterality Date  ? CARDIAC CATHETERIZATION  05-15-2008  ? MILD GLOBAL HYPOKINESIS, WORSE INFERIORLY/  MILD DILATED LVSF/  EF 40-45%  ? CARDIOVASCULAR STRESS TEST  04-03-2008  ? LOW RISK NUCLEAR STUDY/ EF 44%/ MILD HYPOKINESIS/ NO ISCHEMIA/  FIXED INFERIOR DEFECT (COULD BE ATTENUATION)  ? CYSTOSCOPY WITH RETROGRADE PYELOGRAM, URETEROSCOPY AND STENT PLACEMENT Left 11/21/2012  ? Procedure: CYSTOSCOPY WITH RETROGRADE PYELOGRAM, URETEROSCOPY AND STENT PLACEMENT;  Surgeon: Lindaann Slough, MD;  Location: [facility] [place];  Service: Urology;  Laterality: Left;  ? HOLMIUM LASER APPLICATION Left 11/21/2012  ? Procedure: HOLMIUM LASER APPLICATION;  Surgeon: Lindaann Slough, MD;  Location: [facility];  Service: Urology;  Laterality: Left;  ? INGUINAL HERNIA REPAIR Right 1997  ? TRANSTHORACIC ECHOCARDIOGRAM  04-03-2008  ? EF 45-50%/  MILD DIFFUSE LV HYPOKINESIS/  NO VALVE ABNORMALITIES  ?  ?Family History  ?Problem Relation Age of Onset  ? Heart disease Father   ?     deceased-- CHF  ? Colon polyps Neg Hx   ? Colon cancer Neg Hx   ? Rectal cancer Neg Hx   ? Stomach cancer Neg Hx   ?  ?Social History  ? ?Socioeconomic History  ? Marital status: Married  ?  Spouse name: Not on file  ? Number of children: Not on file  ? Years of education: Not on file  ? Highest education level: Not on file  ?Occupational History  ? Occupation: [employer]  ?Tobacco Use  ? Smoking status: Every Day  ?  Packs/day: 1.00  ?  Years: 42.00  ?  Pack years: 42.00  ?  Types: Cigarettes  ? Smokeless tobacco: Never  ?Substance and Sexual Activity  ? Alcohol use: Yes  ?  Alcohol/week: 12.0 standard drinks  ?  Types: 12 Cans of beer per week  ? Drug use: No  ? Sexual activity: Not on file  ?Other Topics Concern  ? Not on file  ?Social History Narrative  ? Not on file  ? ?Social Determinants of Health  ? ?Financial Resource Strain: Not  on file  ?Food Insecurity: Not on file  ?Transportation Needs: Not on file  ?Physical Activity: Not on file  ?Stress: Not on file  ?Social Connections: Not on file  ?Intimate Partner Violence: Not on file  ?  ? ?Physical Exam  ? ?Vitals:  ? 04/27/21 2245 04/28/21 0025  ?BP: 119/79 119/82  ?Pulse: 85 80  ?Resp: 16 16  ?Temp: 98.8 ?F (37.1 ?C)   ?SpO2: 98% 96%  ?  ?CONSTITUTIONAL: Well-appearing, NAD ?NEURO/PSYCH:  Alert and oriented x 3, no focal deficits ?EYES:  eyes equal and reactive ?ENT/NECK:  no LAD, no JVD ?CARDIO: Regular rate, well-perfused, normal S1 and S2 ?PULM:  CTAB no wheezing or rhonchi ?GI/GU:  non-distended, non-tender ?MSK/SPINE:  No gross deformities, no edema ?SKIN: Erythematous and indurated patch to the right upper arm ? ? ?*Additional and/or pertinent findings included  in MDM below ? ?Diagnostic and Interventional Summary  ? ? EKG Interpretation ? ?Date/Time:    ?Ventricular Rate:    ?PR Interval:    ?QRS Duration:   ?QT Interval:    ?QTC Calculation:   ?R Axis:     ?Text Interpretation:   ?  ? ?  ? ?Labs Reviewed - No data to display  ?No orders to display  ?  ?Medications - No data to display  ? ?Procedures  /  Critical Care ?Procedures ? ?ED Course and Medical Decision Making  ?Initial Impression and Ddx ?Exam seems most consistent with a dermatitis, does not seem consistent with cellulitis.  Advising continued use of home hydrocortisone.  Nothing to suggest emergent process. ? ?Past medical/surgical history that increases complexity of ED encounter: None ? ?Interpretation of Diagnostics ?Not applicable ? ?Patient Reassessment and Ultimate Disposition/Management ?Discharge home ? ?Patient management required discussion with the following services or consulting groups:  None ? ?Complexity of Problems Addressed ?Acute complicated illness or Injury ? ?Additional Data Reviewed and Analyzed ?Further history obtained from: ?Past medical history and medications listed in the EMR ? ?Additional Factors Impacting ED Encounter Risk ?None ? ?Elmer Sow. Pilar Plate, MD ?[facility] Emergency Medicine ?[facility] [email or web address] ? ?Final Clinical Impressions(s) / ED Diagnoses  ? ?  ICD-10-CM   ?1. Dermatitis  L30.9   ?  ?  ?ED Discharge Orders   ? ? None  ? ?  ?  ? ?Discharge Instructions Discussed with and Provided to Patient:  ? ? ?Discharge Instructions   ? ?  ?You were evaluated in the Emergency Department and after careful evaluation, we did not find any emergent condition requiring admission or further testing in the hospital. ? ?Your exam/testing today was overall reassuring.  Recommend continued use of hydrocortisone cream at home. ? ?Please return to the Emergency Department if you experience any worsening of your condition.  Thank you for allowing [place] to be a part of  your care. ? ? ? ? ?  ?Sabas Sous, MD ?04/28/21 0201 ? ?

## 2021-04-28 NOTE — Discharge Instructions (Signed)
You were evaluated in the Emergency Department and after careful evaluation, we did not find any emergent condition requiring admission or further testing in the hospital. ? ?Your exam/testing today was overall reassuring.  Recommend continued use of hydrocortisone cream at home. ? ?Please return to the Emergency Department if you experience any worsening of your condition.  Thank you for allowing [place] to be a part of your care. ? ?

## 2021-07-09 ENCOUNTER — Ambulatory Visit: Payer: 59 | Admitting: Urology

## 2021-07-09 NOTE — Progress Notes (Deleted)
Assessment: 1. BPH with obstruction/lower urinary tract symptoms   2. Urine frequency      Plan: Continue alfuzosin 10 mg daily.   Continue Myrbetriq 25 mg daily.  Samples and rx given. Bladder diet instructions given Return to office in 3 months  Chief Complaint:  No chief complaint on file.    History of Present Illness:  Jeffery Frye is a 66 y.o. year old male who is seen for further evaluation of BPH with obstruction and lower urinary tract symptoms.  At his initial visit in 9/22, he noted onset of some penile pain after voiding approximately 1 month prior.  He also had some discomfort in the perineal area.  This was worsened with caffeine and alcohol intake.  He also noted some urinary symptoms including frequency, urgency, intermittent stream, and sensation of incomplete emptying.  No gross hematuria or flank pain.  No fevers or chills.  He was initially treated with doxycycline but discontinued this due to worsening of his symptoms.  He completed a course of cephalexin with some slight improvement in his symptoms.  He denies any pain with ejaculation.  No scrotal pain or swelling.  His STD evaluation was negative. AUA score = 20. He was treated with Bactrim x 20 days for prostatitis.  At the time of his visit in 12/22, he had completed the Bactrim.  He reported some improvement in his urinary symptoms following the course of antibiotics.  He continued to have urinary frequency, nocturia, intermittent stream, hesitancy, and straining to void.   AUA score = 26. PVR = 1 mL. PSA 12/22: 1.3 He was given a trial of alfuzosin 10 mg daily.   At his visit in 2/23, he continued on alfuzosin 10 mg daily.  He continued to have lower urinary tract symptoms including frequency, urgency, intermittent stream, decreased force of stream, and sensation of incomplete emptying.  No dysuria or gross hematuria. IPSS = 26.  PVR = 8 ml. He continued on tadalafil 20 mg as needed for erectile  dysfunction. He was given a trial of Myrbetriq 25 mg daily.  At his visit in 3/23, he continued on alfuzosin and Myrbetriq.  He continued to report urinary symptoms including frequency, urgency, decreased force of stream, straining, and sensation of incomplete emptying.  He felt like his urinary symptoms had improved with the addition of Myrbetriq.  He was having decreased frequency and urgency.  No side effects from the medication.  No dysuria or gross hematuria. IPSS = 21.  PVR = 17 ml.  He returns today for follow-up.  Portions of the above documentation were copied from a prior visit for review purposes only.   Past Medical History:  Past Medical History:  Diagnosis Date   Arthritis    Barrett esophagus    BPH (benign prostatic hypertrophy)    Frequency of urination    GERD (gastroesophageal reflux disease)    H/O hiatal hernia    History of prostatitis    HX RECURRENT   Hyperlipidemia    Left ureteral calculus    Nonischemic cardiomyopathy ([place]) PER PT ON 11-17-2012 STATED HAS TAKEN HIS COREG FOR A YEAR    LAST LOV TO CARDIOLOGIST  2010-  PCP DR Cathlean Cower    Stenosis of both internal carotid arteries    MILD--  0-39%  PER DUPLEX OF 2011   Urgency of urination     Past Surgical History:  Past Surgical History:  Procedure Laterality Date   CARDIAC CATHETERIZATION  05-15-2008  MILD GLOBAL HYPOKINESIS, WORSE INFERIORLY/  MILD DILATED LVSF/  EF 40-45%   CARDIOVASCULAR STRESS TEST  04-03-2008   LOW RISK NUCLEAR STUDY/ EF 44%/ MILD HYPOKINESIS/ NO ISCHEMIA/  FIXED INFERIOR DEFECT (COULD BE ATTENUATION)   CYSTOSCOPY WITH RETROGRADE PYELOGRAM, URETEROSCOPY AND STENT PLACEMENT Left 11/21/2012   Procedure: CYSTOSCOPY WITH RETROGRADE PYELOGRAM, URETEROSCOPY AND STENT PLACEMENT;  Surgeon: Hanley Ben, MD;  Location: [place];  Service: Urology;  Laterality: Left;   HOLMIUM LASER APPLICATION Left [id number]   Procedure: HOLMIUM LASER APPLICATION;  Surgeon:  Hanley Ben, MD;  Location: [place];  Service: Urology;  Laterality: Left;   INGUINAL HERNIA REPAIR Right 1997   TRANSTHORACIC ECHOCARDIOGRAM  04-03-2008   EF 45-50%/  MILD DIFFUSE LV HYPOKINESIS/  NO VALVE ABNORMALITIES    Allergies:  No Known Allergies  Family History:  Family History  Problem Relation Age of Onset   Heart disease Father        deceased-- CHF   Colon polyps Neg Hx    Colon cancer Neg Hx    Rectal cancer Neg Hx    Stomach cancer Neg Hx     Social History:  Social History   Tobacco Use   Smoking status: Every Day    Packs/day: 1.00    Years: 42.00    Total pack years: 42.00    Types: Cigarettes   Smokeless tobacco: Never  Substance Use Topics   Alcohol use: Yes    Alcohol/week: 12.0 standard drinks of alcohol    Types: 12 Cans of beer per week   Drug use: No    ROS: Constitutional:  Negative for fever, chills, weight loss CV: Negative for chest pain, previous MI, hypertension Respiratory:  Negative for shortness of breath, wheezing, sleep apnea, frequent cough GI:  Negative for nausea, vomiting, bloody stool, GERD  Physical exam: There were no vitals taken for this visit. GENERAL APPEARANCE:  Well appearing, well developed, well nourished, NAD HEENT:  Atraumatic, normocephalic, oropharynx clear NECK:  Supple without lymphadenopathy or thyromegaly ABDOMEN:  Soft, non-tender, no masses EXTREMITIES:  Moves all extremities well, without clubbing, cyanosis, or edema NEUROLOGIC:  Alert and oriented x 3, normal gait, CN II-XII grossly intact MENTAL STATUS:  appropriate BACK:  Non-tender to palpation, No CVAT SKIN:  Warm, dry, and intact    Results: U/A

## 2021-11-05 ENCOUNTER — Ambulatory Visit: Payer: 59 | Admitting: Urology

## 2022-01-31 ENCOUNTER — Other Ambulatory Visit: Payer: Self-pay | Admitting: Urology

## 2022-01-31 DIAGNOSIS — N138 Other obstructive and reflux uropathy: Secondary | ICD-10-CM

## 2022-03-15 ENCOUNTER — Ambulatory Visit (HOSPITAL_COMMUNITY)
Admission: EM | Admit: 2022-03-15 | Discharge: 2022-03-15 | Disposition: A | Discharge status: Home / Self Care | Payer: 59 | Attending: Internal Medicine | Admitting: Internal Medicine

## 2022-03-15 ENCOUNTER — Ambulatory Visit (INDEPENDENT_AMBULATORY_CARE_PROVIDER_SITE_OTHER): Payer: 59

## 2022-03-15 ENCOUNTER — Encounter (HOSPITAL_COMMUNITY): Payer: Self-pay

## 2022-03-15 DIAGNOSIS — F17210 Nicotine dependence, cigarettes, uncomplicated: Secondary | ICD-10-CM

## 2022-03-15 DIAGNOSIS — R062 Wheezing: Secondary | ICD-10-CM | POA: Diagnosis not present

## 2022-03-15 DIAGNOSIS — R059 Cough, unspecified: Secondary | ICD-10-CM | POA: Diagnosis not present

## 2022-03-15 DIAGNOSIS — J209 Acute bronchitis, unspecified: Secondary | ICD-10-CM

## 2022-03-15 HISTORY — DX: Disorder of kidney and ureter, unspecified: N28.9

## 2022-03-15 MED ORDER — ALBUTEROL SULFATE HFA 108 (90 BASE) MCG/ACT IN AERS: 2.0000 | INHALATION_SPRAY | RESPIRATORY_TRACT | 0 refills | Status: AC | PRN

## 2022-03-15 MED ORDER — PREDNISONE 20 MG PO TABS: 40.0000 mg | ORAL_TABLET | Freq: Every day | ORAL | 0 refills | Status: AC

## 2022-03-15 MED ORDER — BENZONATATE 100 MG PO CAPS: 100.0000 mg | ORAL_CAPSULE | Freq: Three times a day (TID) | ORAL | 0 refills | Status: DC | PRN

## 2022-03-15 NOTE — ED Triage Notes (Signed)
Patient reports that he has a nephew that lives with him and that family member has been sick.  Patient states that he has had a cough and body aches, but none today.  Patient  states that he has been using Mucinex and Dayquil. Patient denies taking any medication today.

## 2022-03-15 NOTE — Discharge Instructions (Addendum)
Please maintain adequate hydration Take medications as prescribed Chest x-ray is negative for pneumonia Smoke cessation will help with symptoms Return  to urgent care if symptoms worsen.

## 2022-03-17 NOTE — ED Provider Notes (Addendum)
[place]    CSN: [id number] Arrival date & time: 03/15/22  1030      History   Chief Complaint Chief Complaint  Patient presents with   Cough    HPI Jeffery Frye is a 67 y.o. male was to the urgent care with several days history of nonproductive cough, generalized body ache and wheezing.  Patient's symptoms started insidiously and has been persistent.  He denies any fever or chills.  He has no chest tightness but denies any sputum production.  No dizziness, near syncope or syncopal episodes.  He endorses sick contact.  No diarrhea.  No shortness of breath at rest or on exertion.  Patient smokes cigarettes.  He has a cough and a 20-pack-year history of smoking.  He has no diagnosis of COPD or inhaler use.  He denies any chest pain at this time.  Generalized body aches has improved. HPI  Past Medical History:  Diagnosis Date   Arthritis    Barrett esophagus    BPH (benign prostatic hypertrophy)    Frequency of urination    GERD (gastroesophageal reflux disease)    H/O hiatal hernia    History of prostatitis    HX RECURRENT   Hyperlipidemia    Left ureteral calculus    Nonischemic cardiomyopathy ([facility]) PER PT ON 11-17-2012 STATED HAS TAKEN HIS COREG FOR A YEAR    LAST LOV TO CARDIOLOGIST  2010-  PCP DR Cathlean Cower    Renal disorder    Stenosis of both internal carotid arteries    MILD--  0-39%  PER DUPLEX OF 2011   Urgency of urination     Patient Active Problem List   Diagnosis Date Noted   Chronic prostatitis 10/28/2020   OSA (obstructive sleep apnea) 02/09/2013   Preventative health care 08/31/2012   CAD (coronary artery disease)    Gross hematuria 08/30/2012   Abdominal pain, other specified site 08/30/2012   CAROTID ARTERY DISEASE 04/15/2009   TOBACCO ABUSE 05/07/2008   CARDIOMYOPATHY 04/04/2008   HYPERLIPIDEMIA 03/27/2008   Acute prostatitis 03/27/2008   DIZZINESS 03/27/2008   GERD 06/07/2007   UNSPECIFIED HEPATITIS 06/07/2007   BENIGN  PROSTATIC HYPERTROPHY 06/07/2007   COLONIC POLYPS, HX OF 06/07/2007   BARRETT'S ESOPHAGUS, HX OF 06/07/2007    Past Surgical History:  Procedure Laterality Date   CARDIAC CATHETERIZATION  05-15-2008   MILD GLOBAL HYPOKINESIS, WORSE INFERIORLY/  MILD DILATED LVSF/  EF 40-45%   CARDIOVASCULAR STRESS TEST  04-03-2008   LOW RISK NUCLEAR STUDY/ EF 44%/ MILD HYPOKINESIS/ NO ISCHEMIA/  FIXED INFERIOR DEFECT (COULD BE ATTENUATION)   CYSTOSCOPY WITH RETROGRADE PYELOGRAM, URETEROSCOPY AND STENT PLACEMENT Left 11/21/2012   Procedure: CYSTOSCOPY WITH RETROGRADE PYELOGRAM, URETEROSCOPY AND STENT PLACEMENT;  Surgeon: Hanley Ben, MD;  Location: [facility];  Service: Urology;  Laterality: Left;   HOLMIUM LASER APPLICATION Left [id number]   Procedure: HOLMIUM LASER APPLICATION;  Surgeon: Hanley Ben, MD;  Location: [facility];  Service: Urology;  Laterality: Left;   INGUINAL HERNIA REPAIR Right 1997   TRANSTHORACIC ECHOCARDIOGRAM  04-03-2008   EF 45-50%/  MILD DIFFUSE LV HYPOKINESIS/  NO VALVE ABNORMALITIES       Home Medications    Prior to Admission medications   Medication Sig Start Date End Date Taking? Authorizing Provider  albuterol (VENTOLIN HFA) 108 (90 Base) MCG/ACT inhaler Inhale 2 puffs into the lungs every 4 (four) hours as needed for wheezing or shortness of breath. 03/15/22  Yes Elin Seats, Myrene Galas,  MD  benzonatate (TESSALON) 100 MG capsule Take 1 capsule (100 mg total) by mouth 3 (three) times daily as needed for cough. 03/15/22  Yes Stehanie Ekstrom, Myrene Galas, MD  predniSONE (DELTASONE) 20 MG tablet Take 2 tablets (40 mg total) by mouth daily for 5 days. 03/15/22 03/20/22 Yes Robertlee Rogacki, Myrene Galas, MD  alfuzosin (UROXATRAL) 10 MG 24 hr tablet Take 1 tablet (10 mg total) by mouth daily with breakfast. 01/02/21   Stoneking, Reece Leader., MD  aspirin 81 MG tablet Take 81 mg by mouth daily.      [provider]  tadalafil (CIALIS) 20 MG tablet Take 20 mg by  mouth as needed. 12/01/20   [provider]    Family History Family History  Problem Relation Age of Onset   Heart disease Father        deceased-- CHF   Colon polyps Neg Hx    Colon cancer Neg Hx    Rectal cancer Neg Hx    Stomach cancer Neg Hx     Social History Social History   Tobacco Use   Smoking status: Every Day    Packs/day: 1.00    Years: 42.00    Total pack years: 42.00    Types: Cigarettes   Smokeless tobacco: Never  Vaping Use   Vaping Use: Some days  Substance Use Topics   Alcohol use: Yes    Alcohol/week: 12.0 standard drinks of alcohol    Types: 12 Cans of beer per week   Drug use: No     Allergies   Patient has no known allergies.   Review of Systems Review of Systems As per HPI  Physical Exam Triage Vital Signs ED Triage Vitals  Enc Vitals Group     BP 03/15/22 1223 136/89     Pulse Rate 03/15/22 1223 79     Resp 03/15/22 1223 16     Temp 03/15/22 1223 (!) 97.5 F (36.4 C)     Temp Source 03/15/22 1223 Oral     SpO2 03/15/22 1223 93 %     Weight --      Height --      Head Circumference --      Peak Flow --      Pain Score 03/15/22 1226 0     Pain Loc --      Pain Edu? --      Excl. in [place]? --    No data found.  Updated Vital Signs BP 136/89 (BP Location: Right Arm)   Pulse 79   Temp (!) 97.5 F (36.4 C) (Oral)   Resp 16   SpO2 93%   Visual Acuity Right Eye Distance:   Left Eye Distance:   Bilateral Distance:    Right Eye Near:   Left Eye Near:    Bilateral Near:     Physical Exam Vitals and nursing note reviewed.  Constitutional:      General: He is not in acute distress.    Appearance: He is not ill-appearing.  HENT:     Right Ear: Tympanic membrane normal.     Left Ear: Tympanic membrane normal.     Mouth/Throat:     Mouth: Mucous membranes are moist.     Pharynx: No oropharyngeal exudate or posterior oropharyngeal erythema.  Cardiovascular:     Rate and Rhythm: Normal rate and regular rhythm.      Pulses: Normal pulses.     Heart sounds: Normal heart sounds.  Pulmonary:  Effort: Pulmonary effort is normal.     Breath sounds: Wheezing present. No rhonchi or rales.  Abdominal:     General: Bowel sounds are normal.     Palpations: Abdomen is soft.  Musculoskeletal:        General: Normal range of motion.  Neurological:     Mental Status: He is alert.      UC Treatments / Results  Labs (all labs ordered are listed, but only abnormal results are displayed) Labs Reviewed - No data to display  EKG   Radiology No results found.  Procedures Procedures (including critical care time)  Medications Ordered in UC Medications - No data to display  Initial Impression / Assessment and Plan / UC Course  I have reviewed the triage vital signs and the nursing notes.  Pertinent labs & imaging results that were available during my care of the patient were reviewed by me and considered in my medical decision making (see chart for details).     1.  Acute bronchitis with bronchospasm: Albuterol inhaler every 4-6 hours as needed for chest tightness or wheezing Tessalon Perles as needed for cough Prednisone 40 mg orally daily for 5 days Patient has adequate air entry in the lungs bilaterally.  No indication for imaging studies at this time  2.  Nicotine dependence: Smoke cessation counseling given.  Patient is in the precontemplative state of smoke cessation.  Time for smoke cessation is less than 10 minutes. Final Clinical Impressions(s) / UC Diagnoses   Final diagnoses:  Acute bronchitis with bronchospasm     Discharge Instructions      Please maintain adequate hydration Take medications as prescribed Chest x-ray is negative for pneumonia Smoke cessation will help with symptoms Return  to urgent care if symptoms worsen.   ED Prescriptions     Medication Sig Dispense Auth. Provider   albuterol (VENTOLIN HFA) 108 (90 Base) MCG/ACT inhaler Inhale 2 puffs into  the lungs every 4 (four) hours as needed for wheezing or shortness of breath. 6.7 g Rayan Dyal, Myrene Galas, MD   benzonatate (TESSALON) 100 MG capsule Take 1 capsule (100 mg total) by mouth 3 (three) times daily as needed for cough. 21 capsule Tammatha Cobb, Myrene Galas, MD   predniSONE (DELTASONE) 20 MG tablet Take 2 tablets (40 mg total) by mouth daily for 5 days. 10 tablet Fabricio Endsley, Myrene Galas, MD      PDMP not reviewed this encounter.   Chase Picket, MD 03/17/22 2143    Chase Picket, MD 03/26/22 [phone]

## 2022-04-01 ENCOUNTER — Other Ambulatory Visit: Payer: Self-pay | Admitting: Urology

## 2022-04-01 DIAGNOSIS — N401 Enlarged prostate with lower urinary tract symptoms: Secondary | ICD-10-CM

## 2022-05-05 ENCOUNTER — Other Ambulatory Visit: Payer: Self-pay | Admitting: Urology

## 2022-05-05 DIAGNOSIS — N401 Enlarged prostate with lower urinary tract symptoms: Secondary | ICD-10-CM

## 2023-01-27 ENCOUNTER — Encounter (HOSPITAL_COMMUNITY): Payer: Self-pay

## 2023-01-27 ENCOUNTER — Other Ambulatory Visit: Payer: Self-pay

## 2023-01-27 ENCOUNTER — Emergency Department (HOSPITAL_COMMUNITY)
Admission: EM | Admit: 2023-01-27 | Discharge: 2023-01-28 | Disposition: A | Discharge status: Home / Self Care | Payer: 59 | Attending: Emergency Medicine | Admitting: Emergency Medicine

## 2023-01-27 ENCOUNTER — Ambulatory Visit (HOSPITAL_COMMUNITY)
Admission: EM | Admit: 2023-01-27 | Discharge: 2023-01-27 | Disposition: A | Discharge status: Home / Self Care | Payer: 59 | Attending: Internal Medicine | Admitting: Internal Medicine

## 2023-01-27 DIAGNOSIS — M79604 Pain in right leg: Secondary | ICD-10-CM | POA: Insufficient documentation

## 2023-01-27 DIAGNOSIS — Z7901 Long term (current) use of anticoagulants: Secondary | ICD-10-CM | POA: Insufficient documentation

## 2023-01-27 DIAGNOSIS — I82421 Acute embolism and thrombosis of right iliac vein: Secondary | ICD-10-CM | POA: Diagnosis present

## 2023-01-27 DIAGNOSIS — Z7982 Long term (current) use of aspirin: Secondary | ICD-10-CM | POA: Insufficient documentation

## 2023-01-27 DIAGNOSIS — M79661 Pain in right lower leg: Secondary | ICD-10-CM

## 2023-01-27 DIAGNOSIS — R7989 Other specified abnormal findings of blood chemistry: Secondary | ICD-10-CM | POA: Insufficient documentation

## 2023-01-27 DIAGNOSIS — I428 Other cardiomyopathies: Secondary | ICD-10-CM | POA: Insufficient documentation

## 2023-01-27 LAB — CBC WITH DIFFERENTIAL/PLATELET
Abs Immature Granulocytes: 0.03 10*3/uL (ref 0.00–0.07)
Basophils Absolute: 0 10*3/uL (ref 0.0–0.1)
Basophils Relative: 0 %
Eosinophils Absolute: 0.2 10*3/uL (ref 0.0–0.5)
Eosinophils Relative: 2 %
HCT: 44.8 % (ref 39.0–52.0)
Hemoglobin: 14.8 g/dL (ref 13.0–17.0)
Immature Granulocytes: 0 %
Lymphocytes Relative: 23 %
Lymphs Abs: 2.4 10*3/uL (ref 0.7–4.0)
MCH: 32.1 pg (ref 26.0–34.0)
MCHC: 33 g/dL (ref 30.0–36.0)
MCV: 97.2 fL (ref 80.0–100.0)
Monocytes Absolute: 0.9 10*3/uL (ref 0.1–1.0)
Monocytes Relative: 8 %
Neutro Abs: 6.9 10*3/uL (ref 1.7–7.7)
Neutrophils Relative %: 67 %
Platelets: 144 10*3/uL — ABNORMAL LOW (ref 150–400)
RBC: 4.61 MIL/uL (ref 4.22–5.81)
RDW: 12 % (ref 11.5–15.5)
WBC: 10.5 10*3/uL (ref 4.0–10.5)
nRBC: 0 % (ref 0.0–0.2)

## 2023-01-27 LAB — BASIC METABOLIC PANEL
Anion gap: 12 (ref 5–15)
BUN: 11 mg/dL (ref 8–23)
CO2: 22 mmol/L (ref 22–32)
Calcium: 9.3 mg/dL (ref 8.9–10.3)
Chloride: 104 mmol/L (ref 98–111)
Creatinine, Ser: 0.88 mg/dL (ref 0.61–1.24)
GFR, Estimated: >60 mL/min (ref >60)
Glucose, Bld: 113 mg/dL — ABNORMAL HIGH (ref 70–99)
Potassium: 4 mmol/L (ref 3.5–5.1)
Sodium: 138 mmol/L (ref 135–145)

## 2023-01-27 LAB — D-DIMER, QUANTITATIVE: D-Dimer, Quant: >20 ug{FEU}/mL — ABNORMAL HIGH (ref 0.00–0.50)

## 2023-01-27 NOTE — ED Triage Notes (Signed)
Sent from [facility] for an ultrasound of the rt leg. Has been having pain in his calf and rt groin since last Sunday.

## 2023-01-27 NOTE — ED Provider Notes (Addendum)
[facility]    CSN: [id number] Arrival date & time: 01/27/23  1745      History   Chief Complaint Chief Complaint  Patient presents with   Leg Swelling    HPI Jeffery Frye is a 67 y.o. male comes to the urgent care with 5-day history of right calf pain and swelling.  Patient symptoms started insidiously and has been worsening over the past 5 days.  Pain is aching in nature and aggravated by walking.  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  He denies any shortness of breath, chest pain or chest pressure.  No dizziness, near-syncope or syncopal episodes.  No palpitations or skipped beats.  No cough or sputum production.  No history of blood clots.  No recent weight loss.  No family history of blood clots. HPI  Past Medical History:  Diagnosis Date   Arthritis    Barrett esophagus    BPH (benign prostatic hypertrophy)    Frequency of urination    GERD (gastroesophageal reflux disease)    H/O hiatal hernia    History of prostatitis    HX RECURRENT   Hyperlipidemia    Left ureteral calculus    Nonischemic cardiomyopathy ([facility]) PER PT ON 11-17-2012 STATED HAS TAKEN HIS COREG FOR A YEAR    LAST LOV TO CARDIOLOGIST  2010-  PCP DR Oliver Barre    Renal disorder    Stenosis of both internal carotid arteries    MILD--  0-39%  PER DUPLEX OF 2011   Urgency of urination     Patient Active Problem List   Diagnosis Date Noted   Chronic prostatitis 10/28/2020   OSA (obstructive sleep apnea) 02/09/2013   Preventative health care 08/31/2012   CAD (coronary artery disease)    Gross hematuria 08/30/2012   Abdominal pain, other specified site 08/30/2012   CAROTID ARTERY DISEASE 04/15/2009   TOBACCO ABUSE 05/07/2008   CARDIOMYOPATHY 04/04/2008   HYPERLIPIDEMIA 03/27/2008   Acute prostatitis 03/27/2008   DIZZINESS 03/27/2008   GERD 06/07/2007    UNSPECIFIED HEPATITIS 06/07/2007   BENIGN PROSTATIC HYPERTROPHY 06/07/2007   History of colonic polyps 06/07/2007   BARRETT'S ESOPHAGUS, HX OF 06/07/2007    Past Surgical History:  Procedure Laterality Date   CARDIAC CATHETERIZATION  05-15-2008   MILD GLOBAL HYPOKINESIS, WORSE INFERIORLY/  MILD DILATED LVSF/  EF 40-45%   CARDIOVASCULAR STRESS TEST  04-03-2008   LOW RISK NUCLEAR STUDY/ EF 44%/ MILD HYPOKINESIS/ NO ISCHEMIA/  FIXED INFERIOR DEFECT (COULD BE ATTENUATION)   CYSTOSCOPY WITH RETROGRADE PYELOGRAM, URETEROSCOPY AND STENT PLACEMENT Left 11/21/2012   Procedure: CYSTOSCOPY WITH RETROGRADE PYELOGRAM, URETEROSCOPY AND STENT PLACEMENT;  Surgeon: Lindaann Slough, MD;  Location: [facility] [place];  Service: Urology;  Laterality: Left;   HOLMIUM LASER APPLICATION Left 11/21/2012   Procedure: HOLMIUM LASER APPLICATION;  Surgeon: Lindaann Slough, MD;  Location: [facility];  Service: Urology;  Laterality: Left;   INGUINAL HERNIA REPAIR Right 1997   TRANSTHORACIC ECHOCARDIOGRAM  04-03-2008   EF 45-50%/  MILD DIFFUSE LV HYPOKINESIS/  NO VALVE ABNORMALITIES       Home Medications    Prior to Admission medications   Medication Sig Start Date End Date Taking? Authorizing Provider  albuterol (VENTOLIN HFA) 108 (90 Base) MCG/ACT inhaler Inhale 2 puffs into the lungs every 4 (four) hours as needed for wheezing  or shortness of breath. 03/15/22   Almira Phetteplace, Britta Mccreedy, MD  alfuzosin (UROXATRAL) 10 MG 24 hr tablet Take 1 tablet (10 mg total) by mouth daily with breakfast. NEEDS AN APPT FOR REFILLS 04/02/22   Stoneking, Danford Bad., MD  aspirin 81 MG tablet Take 81 mg by mouth daily.      [provider]  benzonatate (TESSALON) 100 MG capsule Take 1 capsule (100 mg total) by mouth 3 (three) times daily as needed for cough. 03/15/22   Merrilee Jansky, MD  tadalafil (CIALIS) 20 MG tablet Take 20 mg by mouth as needed. 12/01/20   [provider]    Family  History Family History  Problem Relation Age of Onset   Heart disease Father        deceased-- CHF   Colon polyps Neg Hx    Colon cancer Neg Hx    Rectal cancer Neg Hx    Stomach cancer Neg Hx     Social History Social History   Tobacco Use   Smoking status: Every Day    Current packs/day: 1.00    Average packs/day: 1 pack/day for 42.0 years (42.0 ttl pk-yrs)    Types: Cigarettes   Smokeless tobacco: Never  Vaping Use   Vaping status: Some Days  Substance Use Topics   Alcohol use: Yes    Alcohol/week: 12.0 standard drinks of alcohol    Types: 12 Cans of beer per week   Drug use: No     Allergies   Patient has no known allergies.   Review of Systems Review of Systems As per HPI  Physical Exam Triage Vital Signs ED Triage Vitals  Encounter Vitals Group     BP 01/27/23 1842 131/82     Systolic BP Percentile --      Diastolic BP Percentile --      Pulse Rate 01/27/23 1842 88     Resp 01/27/23 1842 18     Temp 01/27/23 1842 98.6 F (37 C)     Temp Source 01/27/23 1842 Oral     SpO2 01/27/23 1842 99 %     Weight 01/27/23 1842 200 lb (90.7 kg)     Height 01/27/23 1842 5\' 9"  (1.753 m)     Head Circumference --      Peak Flow --      Pain Score 01/27/23 1841 9     Pain Loc --      Pain Education --      Exclude from Growth Chart --    No data found.  Updated Vital Signs BP 131/82 (BP Location: Right Arm)   Pulse 88   Temp 98.6 F (37 C) (Oral)   Resp 18   Ht 5\' 9"  (1.753 m)   Wt 90.7 kg   SpO2 99%   BMI 29.53 kg/m   Visual Acuity Right Eye Distance:   Left Eye Distance:   Bilateral Distance:    Right Eye Near:   Left Eye Near:    Bilateral Near:     Physical Exam Vitals and nursing note reviewed.  Constitutional:      General: He is not in acute distress.    Appearance: He is not ill-appearing.  Cardiovascular:     Rate and Rhythm: Normal rate and regular rhythm.     Pulses: Normal pulses.  Pulmonary:     Effort: Pulmonary effort is  normal.     Breath sounds: Normal breath sounds.  Musculoskeletal:     Comments:  Right.  Ccalf tenderness the right cough is significantly larger than the left cough.  Neurological:     Mental Status: He is alert.      UC Treatments / Results  Labs (all labs ordered are listed, but only abnormal results are displayed) Labs Reviewed - No data to display  EKG   Radiology No results found.  Procedures Procedures (including critical care time)  Medications Ordered in UC Medications - No data to display  Initial Impression / Assessment and Plan / UC Course  I have reviewed the triage vital signs and the nursing notes.  Pertinent labs & imaging results that were available during my care of the patient were reviewed by me and considered in my medical decision making (see chart for details).     1.  Right calf pain: Patient is advised to go to the emergency department for DVT screening.  I am concerned that he has deep vein thrombosis extending into the femoral vein.  Patient's respiratory rate, heart rate and pulse oximetry are normal  Final Clinical Impressions(s) / UC Diagnoses   Final diagnoses:  Right calf pain     Discharge Instructions      Please go to the emergency department for ultrasound of your right leg.  I am concerned that you have a blood clot in your leg and your right thigh.   ED Prescriptions   None    PDMP not reviewed this encounter.   Merrilee Jansky, MD 01/27/23 Serena Croissant    Merrilee Jansky, MD 01/27/23 [phone]

## 2023-01-27 NOTE — ED Triage Notes (Signed)
RT Leg Swelling X6 Days. No history of leg problems or swelling. 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 No known falls or injuries. Pain with walking.   Patient tried tylenol 650 mg with mild relief.

## 2023-01-27 NOTE — Discharge Instructions (Signed)
Please go to the emergency department for ultrasound of your right leg.  I am concerned that you have a blood clot in your leg and your right thigh.

## 2023-01-28 ENCOUNTER — Other Ambulatory Visit (HOSPITAL_COMMUNITY): Payer: Self-pay

## 2023-01-28 ENCOUNTER — Ambulatory Visit (HOSPITAL_BASED_OUTPATIENT_CLINIC_OR_DEPARTMENT_OTHER)
Admission: RE | Admit: 2023-01-28 | Discharge: 2023-01-28 | Disposition: A | Discharge status: Home / Self Care | Payer: 59 | Source: Ambulatory Visit | Attending: Emergency Medicine

## 2023-01-28 ENCOUNTER — Encounter (HOSPITAL_COMMUNITY): Payer: Self-pay

## 2023-01-28 ENCOUNTER — Ambulatory Visit (HOSPITAL_BASED_OUTPATIENT_CLINIC_OR_DEPARTMENT_OTHER)
Admission: RE | Admit: 2023-01-28 | Discharge: 2023-01-28 | Disposition: A | Discharge status: Home / Self Care | Payer: 59 | Source: Ambulatory Visit | Attending: Vascular Surgery | Admitting: Vascular Surgery

## 2023-01-28 VITALS — BP 112/76 | HR 87

## 2023-01-28 DIAGNOSIS — I82421 Acute embolism and thrombosis of right iliac vein: Secondary | ICD-10-CM | POA: Insufficient documentation

## 2023-01-28 DIAGNOSIS — M7989 Other specified soft tissue disorders: Secondary | ICD-10-CM | POA: Diagnosis not present

## 2023-01-28 MED ORDER — APIXABAN (ELIQUIS) VTE STARTER PACK (10MG AND 5MG)
ORAL_TABLET | ORAL | 0 refills | Status: DC
  Filled 2023-01-28: qty 74, 30d supply, fill #0

## 2023-01-28 MED ORDER — APIXABAN (ELIQUIS) VTE STARTER PACK (10MG AND 5MG): ORAL_TABLET | ORAL | 0 refills | Status: DC

## 2023-01-28 MED ORDER — APIXABAN (ELIQUIS) EDUCATION KIT FOR DVT/PE PATIENTS: PACK | Freq: Once | Status: DC

## 2023-01-28 MED ORDER — APIXABAN 5 MG PO TABS
10.0000 mg | ORAL_TABLET | Freq: Once | ORAL | Status: AC
  Administered 2023-01-28: 10 mg via ORAL
  Filled 2023-01-28: qty 2

## 2023-01-28 MED ORDER — NICOTINE 21 MG/24HR TD PT24
21.0000 mg | MEDICATED_PATCH | Freq: Every day | TRANSDERMAL | 0 refills | Status: DC
  Filled 2023-01-28: qty 28, 28d supply, fill #0

## 2023-01-28 NOTE — ED Provider Notes (Signed)
Jeffery Frye EMERGENCY DEPARTMENT AT [facility] Provider Note   CSN: [id number] Arrival date & time: 01/27/23  1947     History  Chief Complaint  Patient presents with   Leg Pain    Jeffery Frye is a 67 y.o. male with history of HLD, arthritis, Barrett esophagus, BPH, nonischemic cardiomyopathy, GERD, recurrent prostatitis, internal carotid artery stenosis, who presents to the ER with complaint of R leg pain and swelling. Pt reports symptoms for the past 5 days, made worse with walking. Describes it as an aching pain. 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 Denies any CP or SOB.  He went to urgent care and was sent to the ER for DVT ultrasound.  Patient states his mother passed away from a pulmonary embolism.  He has no history of blood clots.   Leg Pain      Home Medications Prior to Admission medications   Medication Sig Start Date End Date Taking? Authorizing Provider  APIXABAN (ELIQUIS) VTE STARTER PACK (10MG  AND 5MG ) Take as directed on package: start with two-5mg  tablets twice daily for 7 days. On day 8, switch to one-5mg  tablet twice daily. 01/28/23  Yes Chrystine Frogge T, PA-C  albuterol (VENTOLIN HFA) 108 (90 Base) MCG/ACT inhaler Inhale 2 puffs into the lungs every 4 (four) hours as needed for wheezing or shortness of breath. 03/15/22   Lamptey, Britta Mccreedy, MD  alfuzosin (UROXATRAL) 10 MG 24 hr tablet Take 1 tablet (10 mg total) by mouth daily with breakfast. NEEDS AN APPT FOR REFILLS 04/02/22   Stoneking, Danford Bad., MD  aspirin 81 MG tablet Take 81 mg by mouth daily.      [provider]  benzonatate (TESSALON) 100 MG capsule Take 1 capsule (100 mg total) by mouth 3 (three) times daily as needed for cough. 03/15/22   Merrilee Jansky, MD  tadalafil (CIALIS) 20 MG tablet Take 20 mg by mouth as needed. 12/01/20   [provider]      Allergies    Patient has no known allergies.    Review of Systems   Review of  Systems  Cardiovascular:  Positive for leg swelling.  Musculoskeletal:  Positive for myalgias.  All other systems reviewed and are negative.   Physical Exam Updated Vital Signs BP 129/81 (BP Location: Right Arm)   Pulse 91   Temp 98 F (36.7 C) (Oral)   Resp 18   Ht 5\' 9"  (1.753 m)   Wt 89.4 kg   SpO2 99%   BMI 29.09 kg/m  Physical Exam Vitals and nursing note reviewed.  Constitutional:      Appearance: Normal appearance.  HENT:     Head: Normocephalic and atraumatic.  Eyes:     Conjunctiva/sclera: Conjunctivae normal.  Cardiovascular:     Rate and Rhythm: Normal rate and regular rhythm.     Pulses:          Dorsalis pedis pulses are 2+ on the right side and 2+ on the left side.       Posterior tibial pulses are 2+ on the right side and 2+ on the left side.  Pulmonary:     Effort: Pulmonary effort is normal. No respiratory distress.     Breath sounds: Normal breath sounds.  Abdominal:     General: There is no distension.     Palpations: Abdomen is soft.     Tenderness: There is no abdominal tenderness.  Musculoskeletal:  Comments: Compartments of the right calf are soft, but noticeably swollen compared to the left.  No erythema or other overlying skin changes.  Normal sensation.  Skin:    General: Skin is warm and dry.     Capillary Refill: Capillary refill takes less than 2 seconds.  Neurological:     General: No focal deficit present.     Mental Status: He is alert.     Sensory: Sensation is intact.     ED Results / Procedures / Treatments   Labs (all labs ordered are listed, but only abnormal results are displayed) Labs Reviewed  BASIC METABOLIC PANEL - Abnormal; Notable for the following components:      Result Value   Glucose, Bld 113 (*)    All other components within normal limits  CBC WITH DIFFERENTIAL/PLATELET - Abnormal; Notable for the following components:   Platelets 144 (*)    All other components within normal limits  D-DIMER,  QUANTITATIVE - Abnormal; Notable for the following components:   D-Dimer, Quant >20.00 (*)    All other components within normal limits    EKG None  Radiology No results found.  Procedures Procedures    Medications Ordered in ED Medications  apixaban (ELIQUIS) tablet 10 mg (10 mg Oral Given 01/28/23 0340)    ED Course/ Medical Decision Making/ A&P                                 Medical Decision Making Amount and/or Complexity of Data Reviewed Labs: ordered.   This patient is a 67 y.o. male  who presents to the ED for concern of R leg pain.   Differential diagnoses prior to evaluation: The emergent differential diagnosis includes, but is not limited to,  DVT, compartment syndrome, arterial occlusion, cellulitis, arthritis, septic joint. This is not an exhaustive differential.   Past Medical History / Co-morbidities / Social History: HLD, arthritis, Barrett esophagus, BPH, nonischemic cardiomyopathy, GERD, recurrent prostatitis, internal carotid artery stenosis  Additional history: Chart reviewed. Pertinent results include: Reviewed urgent care visit note from earlier this evening  Physical Exam: Physical exam performed. The pertinent findings include: Normal vital signs, no acute distress.  Normal respiratory effort, lung sounds clear.  No complaining any chest pain or shortness of breath.  Right calf appreciably swollen compared to the left.  Neurovascularly intact, with soft compartments.  No erythema or other overlying skin changes.  Lab Tests/Imaging studies: I personally interpreted labs/imaging and the pertinent results include: CBC and BMP unremarkable.  D-dimer over 20.  Unfortunately were not able to perform DVT ultrasound due to staffing.  I did consider imaging of patient's chest including PE study, but he is having no symptoms of PE and with normal vital signs.  Low clinical concern for PE.  Medications: I ordered medication including Eliquis.  I have  reviewed the patients home medicines and have made adjustments as needed.   Disposition: After consideration of the diagnostic results and the patients response to treatment, I feel that emergency department workup does not suggest an emergent condition requiring admission or immediate intervention beyond what has been performed at this time. The plan is: Discharge with treatment of likely DVT with positive D-dimer and associated right calf pain.  Will start Eliquis and order outpatient study to be performed tomorrow.  Patient understands help ordinate is to have this done tomorrow, and plans to return.  We discussed his D-dimer result and  how elevated it is, making a diagnosis of DVT extremely likely, that patient would benefit from starting the Eliquis prescription.  The starter pack was sent to his pharmacy.. The patient is safe for discharge and has been instructed to return immediately for worsening symptoms, change in symptoms or any other concerns.  Final Clinical Impression(s) / ED Diagnoses Final diagnoses:  Right leg pain  Positive D dimer    Rx / DC Orders ED Discharge Orders          Ordered    APIXABAN (ELIQUIS) VTE STARTER PACK (10MG  AND 5MG )       Note to Pharmacy: If starter pack unavailable, substitute with seventy-four 5 mg apixaban tabs following the above SIG directions.   01/28/23 0301    LE Venous       Comments: IMPORTANT PATIENT INSTRUCTIONS:  You have been scheduled for an Outpatient Vascular Study at [facility].    If tomorrow is a Saturday, Sunday or holiday, please go to the [facility] Emergency Department Registration Desk at 11 am tomorrow morning and tell them you are there for a vascular study.   If tomorrow is a weekday (Monday-Friday), please go to [facility] Entrance C, [facility] Registration at 11 am and tell them you are there for a vascular study.   01/28/23 0315           Portions of this report may have  been transcribed using voice recognition software. Every effort was made to ensure accuracy; however, inadvertent computerized transcription errors may be present.    Jeanella Flattery 01/28/23 0349    Sabas Sous, MD 01/28/23 [phone]

## 2023-01-28 NOTE — Discharge Instructions (Signed)
You were seen in the emergency department for right leg pain.  As we discussed your D-dimer level, which correlates with your risk of blood clots, was significantly elevated.  I have concerned that you have a deep venous thrombosis in your right leg.  The definitive way to prove this is with an ultrasound, which we were not able to perform this evening.  We started you on the treatment, which is a blood thinner called Eliquis.  I have attached information about this medication. This medication increases your risk of bleeding, especially with any trauma or falls.  I have placed the order for you to have the ultrasound performed tomorrow as an outpatient.  There should be instructions on your discharge paperwork regarding this.  It is incredibly important that you have this done.  Continue to monitor how you're doing and return to the ER for new or worsening symptoms such as chest pain, shortness of breath, lightheadedness or passing out.

## 2023-01-28 NOTE — Patient Instructions (Addendum)
-  Start apixaban (Eliquis) 10 mg twice daily for 7 days followed by 5 mg twice daily. -We'll fill your Eliquis refill at your next appointment.  -Work on decreasing your smoking. Wear the nicotine patches you got today.  -It is important to take your medication around the same time every day.  -Avoid NSAIDs like ibuprofen (Advil, Motrin) and naproxen (Aleve) as well as aspirin doses over 100 mg daily. -Tylenol (acetaminophen) is the preferred over the counter pain medication to lower the risk of bleeding. -Be sure to alert all of your health care providers that you are taking an anticoagulant prior to starting a new medication or having a procedure. -Monitor for signs and symptoms of bleeding (abnormal bruising, prolonged bleeding, nose bleeds, bleeding from gums, discolored urine, black tarry stools). If you have fallen and hit your head OR if your bleeding is severe or not stopping, seek emergency care.  -Go to the emergency room if emergent signs and symptoms of new clot occur (new or worse swelling and pain in an arm or leg, shortness of breath, chest pain, fast or irregular heartbeats, lightheadedness, dizziness, fainting, coughing up blood) or if you experience a significant color change (pale or blue) in the extremity that has the DVT.  -We recommend you wear compression stockings (20-30 mmHg) as long as you are having swelling or pain. Be sure to purchase the correct size and take them off at night.   Your next visit is on Monday, January 20th at 1:30pm.  [facility] [address] [place], [place], [place] [address] Enter the hospital through Entrance C off [facility] and pull up to the Heart & Vascular Center entrance to the free valet parking.  Check in for your appointment at the Heart & Vascular Center.   If you have any questions or need to reschedule an appointment, please call [phone] [facility].  If you are having an emergency, call 911 or present to the  nearest emergency room.   What is a DVT?  -Deep vein thrombosis (DVT) is a condition in which a blood clot forms in a vein of the deep venous system which can occur in the lower leg, thigh, pelvis, arm, or neck. This condition is serious and can be life-threatening if the clot travels to the arteries of the lungs and causing a blockage (pulmonary embolism, PE). A DVT can also damage veins in the leg, which can lead to long-term venous disease, leg pain, swelling, discoloration, and ulcers or sores (post-thrombotic syndrome).  -Treatment may include taking an anticoagulant medication to prevent more clots from forming and the current clot from growing, wearing compression stockings, and/or surgical procedures to remove or dissolve the clot.

## 2023-01-28 NOTE — Progress Notes (Signed)
Lower extremity venous duplex completed. Please see CV Procedures for preliminary results.  Initial finding reported to [facility], [facility], and follow up with DVT Clinic scheduled.  Shona Simpson, RVT 01/28/23 11:46 AM

## 2023-01-28 NOTE — Progress Notes (Signed)
DVT Clinic Note  Name: Jeffery Frye     MRN: [id number]     DOB: [birth year]     Sex: male  PCP: [facility]  Today's Visit: Visit Information: Initial Visit  Referred to DVT Clinic by: Emergency Department - Lorin Roemhildt, PA-C Referred to CPP by: Dr. Lenell Antu Reason for referral:  Chief Complaint  Patient presents with   DVT   HISTORY OF PRESENT ILLNESS: Jeffery Frye is a 67 y.o. male with PMH HLD, BPH, nonischemic cardiomyopathy, carotid artery stenosis, who presents after diagnosis of DVT for medication management. Patient initially presented to urgent care and was directed to the ED 01/27/23 reporting right leg pain and swelling for 5 days. 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 Vascular ultrasound was closed so he was given a dose of Eliquis 10 mg on 01/28/23 at 0340. He returned today for his ultrasound, which showed extensive acute DVT extending from his right external iliac vein through the calf veins and was referred to DVT Clinic to initiate treatment.   Today, patient presents in a wheelchair accompanied by his wife Velna Hatchet. Endorses pain and swelling in the RLE. No symptoms above the knee. No CP, SOB. Patient denies any history of VTE. His mother passed away from a pulmonary embolism. 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 He hopes to retire 10 months after his 68th birthday (would be around December 2025). He was taking a baby aspirin for at least the past 15 years in light of this but he saw his PCP a couple months ago and they discontinued it because they felt he no longer needed it. Patient currently smokes 1.5 packs per day and is interested in quitting. He denies recent unintentional weight loss.  Positive Thrombotic Risk Factors: Sedentary journey lasting >8 hours within 4 weeks, Smoking Bleeding Risk Factors: None Present  Negative Thrombotic Risk Factors: Previous VTE, Recent surgery  (within 3 months), Recent trauma (within 3 months), Recent admission to hospital with acute illness (within 3 months), Paralysis, paresis, or recent plaster cast immobilization of lower extremity, Central venous catheterization, Pregnancy, Bed rest >72 hours within 3 months, Within 6 weeks postpartum, Recent cesarean section (within 3 months), Estrogen therapy, Testosterone therapy, Erythropoiesis-stimulating agent, Recent COVID diagnosis (within 3 months), Active cancer, Non-malignant, chronic inflammatory condition, Known thrombophilic condition, Obesity, Older age  Rx Insurance Coverage: Commercial Rx Affordability: Eliquis is $35/month (insurance limits 30 days). Can use copay card to reduce cost to $10/month. Rx Assistance Provided:  Free 30-day trial card Co-pay card Preferred Pharmacy: [facility]  Past Medical History:  Diagnosis Date   Arthritis    Barrett esophagus    BPH (benign prostatic hypertrophy)    Frequency of urination    GERD (gastroesophageal reflux disease)    H/O hiatal hernia    History of prostatitis    HX RECURRENT   Hyperlipidemia    Left ureteral calculus    Nonischemic cardiomyopathy (HCC) PER PT ON 11-17-2012 STATED HAS TAKEN HIS COREG FOR A YEAR    LAST LOV TO CARDIOLOGIST  2010-  PCP DR Oliver Barre    Renal disorder    Stenosis of both internal carotid arteries    MILD--  0-39%  PER DUPLEX OF 2011   Urgency of urination     Past Surgical History:  Procedure Laterality Date   CARDIAC CATHETERIZATION  05-15-2008   MILD GLOBAL HYPOKINESIS, WORSE INFERIORLY/  MILD DILATED LVSF/  EF 40-45%   CARDIOVASCULAR STRESS TEST  04-03-2008   LOW RISK NUCLEAR STUDY/ EF 44%/ MILD HYPOKINESIS/ NO ISCHEMIA/  FIXED INFERIOR DEFECT (COULD BE ATTENUATION)   CYSTOSCOPY WITH RETROGRADE PYELOGRAM, URETEROSCOPY AND STENT PLACEMENT Left 11/21/2012   Procedure: CYSTOSCOPY WITH RETROGRADE PYELOGRAM, URETEROSCOPY AND STENT PLACEMENT;  Surgeon: Lindaann Slough, MD;   Location: [facility];  Service: Urology;  Laterality: Left;   HOLMIUM LASER APPLICATION Left 11/21/2012   Procedure: HOLMIUM LASER APPLICATION;  Surgeon: Lindaann Slough, MD;  Location: [facility];  Service: Urology;  Laterality: Left;   INGUINAL HERNIA REPAIR Right 1997   TRANSTHORACIC ECHOCARDIOGRAM  04-03-2008   EF 45-50%/  MILD DIFFUSE LV HYPOKINESIS/  NO VALVE ABNORMALITIES    Social History   Socioeconomic History   Marital status: Married    Spouse name: Not on file   Number of children: Not on file   Years of education: Not on file   Highest education level: Not on file  Occupational History   Occupation: [employer]  Tobacco Use   Smoking status: Every Day    Current packs/day: 1.00    Average packs/day: 1 pack/day for 42.0 years (42.0 ttl pk-yrs)    Types: Cigarettes   Smokeless tobacco: Never  Vaping Use   Vaping status: Some Days  Substance and Sexual Activity   Alcohol use: Yes    Alcohol/week: 12.0 standard drinks of alcohol    Types: 12 Cans of beer per week   Drug use: No   Sexual activity: Not on file  Other Topics Concern   Not on file  Social History Narrative   Not on file   Social Drivers of Health   Financial Resource Strain: Not on file  Food Insecurity: Not on file  Transportation Needs: Not on file  Physical Activity: Not on file  Stress: Not on file  Social Connections: Not on file  Intimate Partner Violence: Not on file    Family History  Problem Relation Age of Onset   Heart disease Father        deceased-- CHF   Colon polyps Neg Hx    Colon cancer Neg Hx    Rectal cancer Neg Hx    Stomach cancer Neg Hx     Allergies as of 01/28/2023   (No Known Allergies)    Current Outpatient Medications on File Prior to Encounter  Medication Sig Dispense Refill   tadalafil (CIALIS) 20 MG tablet Take 20 mg by mouth as needed.     albuterol (VENTOLIN HFA) 108 (90 Base) MCG/ACT inhaler Inhale 2 puffs into  the lungs every 4 (four) hours as needed for wheezing or shortness of breath. (Patient not taking: Reported on 01/28/2023) 6.7 g 0   alfuzosin (UROXATRAL) 10 MG 24 hr tablet Take 1 tablet (10 mg total) by mouth daily with breakfast. NEEDS AN APPT FOR REFILLS (Patient not taking: Reported on 01/28/2023) 30 tablet 0   No current facility-administered medications on file prior to encounter.   REVIEW OF SYSTEMS:  Review of Systems  Respiratory:  Negative for shortness of breath.   Cardiovascular:  Positive for leg swelling. Negative for chest pain and palpitations.  Musculoskeletal:  Positive for myalgias.  Neurological:  Negative for dizziness and tingling.   PHYSICAL EXAMINATION:  Vitals:   01/28/23 1503  BP: 112/76  Pulse: 87  SpO2: 96%     Physical Exam Vitals reviewed.  Cardiovascular:     Rate and  Rhythm: Normal rate.  Pulmonary:     Effort: Pulmonary effort is normal.  Musculoskeletal:        General: Tenderness present.     Right lower leg: Edema present.     Left lower leg: No edema.  Skin:    Findings: Erythema present. No bruising.  Psychiatric:        Mood and Affect: Mood normal.        Behavior: Behavior normal.        Thought Content: Thought content normal.   Villalta Score for Post-Thrombotic Syndrome: Pain: Moderate Cramps: Absent Heaviness: Mild Paresthesia: Absent Pruritus: Absent Pretibial Edema: Moderate Skin Induration: Absent Hyperpigmentation: Absent Redness: Mild Venous Ectasia: Absent Pain on calf compression: Mild Villalta Preliminary Score: 7 Is venous ulcer present?: No If venous ulcer is present and score is <15, then 15 points total are assigned: Absent Villalta Total Score: 7  LABS:  CBC     Component Value Date/Time   WBC 10.5 01/27/2023 2113   RBC 4.61 01/27/2023 2113   HGB 14.8 01/27/2023 2113   HCT 44.8 01/27/2023 2113   PLT 144 (L) 01/27/2023 2113   MCV 97.2 01/27/2023 2113   MCH 32.1 01/27/2023 2113   MCHC 33.0  01/27/2023 2113   RDW 12.0 01/27/2023 2113   LYMPHSABS 2.4 01/27/2023 2113   MONOABS 0.9 01/27/2023 2113   EOSABS 0.2 01/27/2023 2113   BASOSABS 0.0 01/27/2023 2113    Hepatic Function      Component Value Date/Time   PROT 7.7 08/30/2012 1232   ALBUMIN 4.2 08/30/2012 1232   AST 16 08/30/2012 1232   ALT 18 08/30/2012 1232   ALKPHOS 58 08/30/2012 1232   BILITOT 0.8 08/30/2012 1232   BILIDIR 0.1 08/30/2012 1232    Renal Function   Lab Results  Component Value Date   CREATININE 0.88 01/27/2023   CREATININE 1.1 08/30/2012   CREATININE 1.00 08/28/2012    Estimated Creatinine Clearance: 92.6 mL/min (by C-G formula based on SCr of 0.88 mg/dL).   VVS Vascular Lab Studies:  01/28/23 VAS [place] LOWER EXTREMITY VENOUS (DVT)RIGHT  Summary:  RIGHT:  - Findings consistent with acute deep vein thrombosis involving the right common femoral vein, right femoral vein, right popliteal vein, right posterior tibial veins, right peroneal veins, and EIV.  - Findings consistent with acute intramuscular thrombosis involving the right gastrocnemius veins.    LEFT:  - No evidence of common femoral vein obstruction.   ASSESSMENT: Location of DVT: Right iliac vein, Right common femoral vein, Right femoral vein, Right popliteal vein, Right distal vein  Patient with extensive iliofemoral DVT. Due to involvement of the common femoral and external iliac veins, discussed the patient with Dr. Lenell Antu. Given only partial occlusion in the common femoral and external iliac veins and the patient having no pain or swelling above the knee, no need for vascular surgery intervention at this time. Will initiate anticoagulation with Eliquis. 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 Dr. Lenell Antu recommends treating for 3 months then switching back to low dose aspirin, and in the meantime patient needs to see his PCP for  cancer screening. If he were to develop another DVT in the future, he would need anticoagulation indefinitely. CBC and BMET updated in the ED yesterday and are without concerns for Eliquis start. Filled starter pack during his visit today. He asks to get his  refills at his next visit which we will arrange.   Discussed smoking as a risk factor for DVTs. He has quit before but is back to smoking 1.5 packs per day. He is motivated to quit in light of his DVT diagnosis. He asks if he can start using nicotine patches. This was also filled at our pharmacy today, and we will discuss his progress at his next visit.   PLAN: -Start apixaban (Eliquis) 10 mg twice daily for 7 days followed by 5 mg twice daily. -Expected duration of therapy: 3 months. Therapy started on 01/28/23. -Patient educated on purpose, proper use and potential adverse effects of apixaban (Eliquis). -Discussed importance of taking medication around the same time every day. -Advised patient of medications to avoid (NSAIDs, aspirin doses >100 mg daily). -Educated that Tylenol (acetaminophen) is the preferred analgesic to lower the risk of bleeding. -Advised patient to alert all providers of anticoagulation therapy prior to starting a new medication or having a procedure. -Emphasized importance of monitoring for signs and symptoms of bleeding (abnormal bruising, prolonged bleeding, nose bleeds, bleeding from gums, discolored urine, black tarry stools). -Educated patient to present to the ED if emergent signs and symptoms of new thrombosis occur. -Counseled patient to wear compression stockings daily, removing at night. Elevate legs to help improve swelling.   Follow up: 1 month in DVT Clinic.   Pervis Hocking, PharmD, P[place]y [place], CPP Deep Vein Thrombosis Clinic Clinical Pharmacist Practitioner Office: [phone]

## 2023-02-01 ENCOUNTER — Other Ambulatory Visit: Payer: Self-pay | Admitting: Urology

## 2023-02-01 DIAGNOSIS — N401 Enlarged prostate with lower urinary tract symptoms: Secondary | ICD-10-CM

## 2023-02-21 ENCOUNTER — Emergency Department (HOSPITAL_COMMUNITY)
Admission: EM | Admit: 2023-02-21 | Discharge: 2023-02-21 | Disposition: A | Discharge status: Home / Self Care | Payer: 59 | Attending: Emergency Medicine | Admitting: Emergency Medicine

## 2023-02-21 ENCOUNTER — Emergency Department (HOSPITAL_COMMUNITY): Payer: 59

## 2023-02-21 ENCOUNTER — Other Ambulatory Visit (HOSPITAL_COMMUNITY): Payer: Self-pay

## 2023-02-21 ENCOUNTER — Other Ambulatory Visit: Payer: Self-pay

## 2023-02-21 ENCOUNTER — Emergency Department (HOSPITAL_BASED_OUTPATIENT_CLINIC_OR_DEPARTMENT_OTHER)
Admission: RE | Admit: 2023-02-21 | Discharge: 2023-02-21 | Disposition: A | Discharge status: Home / Self Care | Payer: 59 | Source: Ambulatory Visit | Attending: Vascular Surgery | Admitting: Vascular Surgery

## 2023-02-21 DIAGNOSIS — S0093XA Contusion of unspecified part of head, initial encounter: Secondary | ICD-10-CM | POA: Insufficient documentation

## 2023-02-21 DIAGNOSIS — Z7901 Long term (current) use of anticoagulants: Secondary | ICD-10-CM | POA: Diagnosis not present

## 2023-02-21 DIAGNOSIS — I82421 Acute embolism and thrombosis of right iliac vein: Secondary | ICD-10-CM | POA: Diagnosis not present

## 2023-02-21 DIAGNOSIS — S0990XA Unspecified injury of head, initial encounter: Secondary | ICD-10-CM | POA: Diagnosis present

## 2023-02-21 MED ORDER — APIXABAN 5 MG PO TABS
5.0000 mg | ORAL_TABLET | Freq: Two times a day (BID) | ORAL | 1 refills | Status: DC
  Filled 2023-02-21: qty 60, 30d supply, fill #0
  Filled 2023-03-30: qty 60, 30d supply, fill #1

## 2023-02-21 NOTE — Patient Instructions (Signed)
-  Continue apixaban (Eliquis) 5 mg twice daily. -We will get your last month of refills at your next appointment.  -Continue to work on decreasing your smoking.  -It is important to take your medication around the same time every day.  -Avoid NSAIDs like ibuprofen (Advil, Motrin) and naproxen (Aleve) as well as aspirin doses over 100 mg daily. -Tylenol (acetaminophen) is the preferred over the counter pain medication to lower the risk of bleeding. -Be sure to alert all of your health care providers that you are taking an anticoagulant prior to starting a new medication or having a procedure. -Monitor for signs and symptoms of bleeding (abnormal bruising, prolonged bleeding, nose bleeds, bleeding from gums, discolored urine, black tarry stools). If you have fallen and hit your head OR if your bleeding is severe or not stopping, seek emergency care.  -Go to the emergency room if emergent signs and symptoms of new clot occur (new or worse swelling and pain in an arm or leg, shortness of breath, chest pain, fast or irregular heartbeats, lightheadedness, dizziness, fainting, coughing up blood) or if you experience a significant color change (pale or blue) in the extremity that has the DVT.  -We recommend you wear compression stockings (20-30 mmHg) as long as you are having swelling or pain. Be sure to purchase the correct size and take them off at night.   Your next visit is on Wednesday February 26th at 1:30PM.  [facility] [address] [place], [place], [place] [address] Enter the hospital through Entrance C off [facility] and pull up to the Heart & Vascular Center entrance to the free valet parking.  Check in for your appointment at the Heart & Vascular Center.   If you have any questions or need to reschedule an appointment, please call [phone] [facility].  If you are having an emergency, call 911 or present to the nearest emergency room.   What is a DVT?  -Deep vein  thrombosis (DVT) is a condition in which a blood clot forms in a vein of the deep venous system which can occur in the lower leg, thigh, pelvis, arm, or neck. This condition is serious and can be life-threatening if the clot travels to the arteries of the lungs and causing a blockage (pulmonary embolism, PE). A DVT can also damage veins in the leg, which can lead to long-term venous disease, leg pain, swelling, discoloration, and ulcers or sores (post-thrombotic syndrome).  -Treatment may include taking an anticoagulant medication to prevent more clots from forming and the current clot from growing, wearing compression stockings, and/or surgical procedures to remove or dissolve the clot.
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
## 2023-02-21 NOTE — Discharge Instructions (Signed)
Your CT scans were reassuring.  I recommend you follow-up closely with your primary care doctor.  Return for new or worsening symptoms.
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
## 2023-02-21 NOTE — Progress Notes (Signed)
Orthopedic Tech Progress Note Patient Details:  Jeffery Frye 12/26/1955 [id number]  LEVEL 2 TRAUMA   Patient ID: Jeffery Frye, male   DOB: [birth year], 68 y.o.   MRN: [id number]  Jeffery Frye 02/21/2023, 12:02 PM
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
## 2023-03-08 ENCOUNTER — Other Ambulatory Visit (HOSPITAL_COMMUNITY): Payer: Self-pay

## 2023-03-30 ENCOUNTER — Encounter (HOSPITAL_COMMUNITY): Payer: Self-pay | Admitting: Student-PharmD

## 2023-03-30 ENCOUNTER — Other Ambulatory Visit (HOSPITAL_COMMUNITY): Payer: Self-pay

## 2023-03-30 ENCOUNTER — Ambulatory Visit (HOSPITAL_COMMUNITY)
Admission: RE | Admit: 2023-03-30 | Discharge: 2023-03-30 | Disposition: A | Discharge status: Home / Self Care | Payer: 59 | Source: Ambulatory Visit | Attending: Surgery | Admitting: Surgery

## 2023-03-30 VITALS — BP 124/82 | HR 78

## 2023-03-30 DIAGNOSIS — I82421 Acute embolism and thrombosis of right iliac vein: Secondary | ICD-10-CM | POA: Insufficient documentation

## 2023-03-30 NOTE — Patient Instructions (Signed)
-  Continue apixaban (Eliquis) 5 mg twice daily.  -Continue working on decreasing your smoking.  -It is important to take your medication around the same time every day.  -Avoid NSAIDs like ibuprofen (Advil, Motrin) and naproxen (Aleve) as well as aspirin doses over 100 mg daily. -Tylenol (acetaminophen) is the preferred over the counter pain medication to lower the risk of bleeding. -Be sure to alert all of your health care providers that you are taking an anticoagulant prior to starting a new medication or having a procedure. -Monitor for signs and symptoms of bleeding (abnormal bruising, prolonged bleeding, nose bleeds, bleeding from gums, discolored urine, black tarry stools). If you have fallen and hit your head OR if your bleeding is severe or not stopping, seek emergency care.  -Go to the emergency room if emergent signs and symptoms of new clot occur (new or worse swelling and pain in an arm or leg, shortness of breath, chest pain, fast or irregular heartbeats, lightheadedness, dizziness, fainting, coughing up blood) or if you experience a significant color change (pale or blue) in the extremity that has the DVT.  -We recommend you wear compression stockings (20-30 mmHg) as long as you are having swelling or pain. Be sure to purchase the correct size and take them off at night.   Your next visit is on Wednesday March 26th at 2pm.  [facility] [address] [place], [place], [place] [address] Enter the hospital through Entrance C off [facility] and pull up to the [facility] Vascular Center entrance to the free valet parking.  Check in for your appointment at the Heart & Vascular Center.   If you have any questions or need to reschedule an appointment, please call [phone] [facility].  If you are having an emergency, call 911 or present to the nearest emergency room.   What is a DVT?  -Deep vein thrombosis (DVT) is a condition in which a blood clot forms in a vein of  the deep venous system which can occur in the lower leg, thigh, pelvis, arm, or neck. This condition is serious and can be life-threatening if the clot travels to the arteries of the lungs and causing a blockage (pulmonary embolism, PE). A DVT can also damage veins in the leg, which can lead to long-term venous disease, leg pain, swelling, discoloration, and ulcers or sores (post-thrombotic syndrome).  -Treatment may include taking an anticoagulant medication to prevent more clots from forming and the current clot from growing, wearing compression stockings, and/or surgical procedures to remove or dissolve the clot.

## 2023-03-30 NOTE — Progress Notes (Cosign Needed)
 DVT Clinic Note  Name: Jeffery Frye     MRN: [id number]     DOB: 1955/03/07     Sex: male  PCP: [facility]  Today's Visit: Visit Information: Follow Up Visit  Referred to DVT Clinic by: Emergency Department - Lorin Roemhildt, PA-C Referred to CPP by: Dr. Myra Gianotti Reason for referral:  Chief Complaint  Patient presents with   Med Management - DVT   HISTORY OF PRESENT ILLNESS: Jeffery Frye is a 68 y.o. male with PMH HLD, BPH, nonischemic cardiomyopathy, carotid artery stenosis, who presents after diagnosis of DVT for medication management. Patient initially presented to urgent care and was directed to the ED 01/27/23 reporting right leg pain and swelling for 5 days. 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 Vascular ultrasound was closed so he was given a dose of Eliquis 10 mg on 01/28/23 at 0340. He returned 01/28/23 for his ultrasound, which showed extensive acute DVT extending from his right external iliac vein through the calf veins and was referred to DVT Clinic to initiate treatment. 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 He took a baby aspirin for the past 15 years in light of his work, but his PCP discontinued this a couple months ago. Dr. Lenell Antu recommended no vascular intervention. We started anticoagulation with Eliquis. Patient requested assistance with smoking cessation at that time so nicotine patches were prescribed. Last seen 02/21/23 with RLE symptom improvement, Eliquis was continued, smoking cessation efforts were reinforced.   Today patient reports that his leg continues to feel much better. Swelling has improved with compression. Denies abnormal bleeding or bruising. Reports 1 missed doses of Eliquis since his last visit. Endorses wearing compression stockings daily while at work and elevating his legs once he gets home. Reports that he started using the 21 mg nicotine patch a couple weeks ago, he  is down to smoking 0.5 ppd and hopes to continue decreasing until he has completely quit. Biggest triggers are waking up, drinking alcohol, and boredom driving in the garbage truck. No adverse effects with the nicotine patch but he does feel his hard racing/feel like he's catching his breath if he smokes multiple cigarettes back to back since starting to wear the patch.   Positive Thrombotic Risk Factors: Sedentary journey lasting >8 hours within 4 weeks, Smoking Bleeding Risk Factors: Age >65 years, Anticoagulant therapy  Negative Thrombotic Risk Factors: Previous VTE, Recent surgery (within 3 months), Recent trauma (within 3 months), Recent admission to hospital with acute illness (within 3 months), Paralysis, paresis, or recent plaster cast immobilization of lower extremity, Central venous catheterization, Bed rest >72 hours within 3 months, Pregnancy, Within 6 weeks postpartum, Recent cesarean section (within 3 months), Estrogen therapy, Testosterone therapy, Erythropoiesis-stimulating agent, Recent COVID diagnosis (within 3 months), Active cancer, Non-malignant, chronic inflammatory condition, Known thrombophilic condition, Obesity, Older age  Rx Insurance Coverage: Commercial Rx Affordability: Eliquis is $35/month (insurance limits 30 days). Can use copay card to reduce cost to $10/month.  Rx Assistance Provided:  Free 30-day trial card Co-pay card Preferred Pharmacy: [facility]  Past Medical History:  Diagnosis Date   Arthritis    Barrett esophagus    BPH (benign prostatic hypertrophy)    Frequency of urination    GERD (gastroesophageal reflux disease)    H/O hiatal hernia    History of prostatitis    HX RECURRENT   Hyperlipidemia    Left ureteral calculus  Nonischemic cardiomyopathy (HCC) PER PT ON 11-17-2012 STATED HAS TAKEN HIS COREG FOR A YEAR    LAST LOV TO CARDIOLOGIST  2010-  PCP DR Oliver Barre    Renal disorder    Stenosis of both internal carotid arteries     MILD--  0-39%  PER DUPLEX OF 2011   Urgency of urination     Past Surgical History:  Procedure Laterality Date   CARDIAC CATHETERIZATION  05-15-2008   MILD GLOBAL HYPOKINESIS, WORSE INFERIORLY/  MILD DILATED LVSF/  EF 40-45%   CARDIOVASCULAR STRESS TEST  04-03-2008   LOW RISK NUCLEAR STUDY/ EF 44%/ MILD HYPOKINESIS/ NO ISCHEMIA/  FIXED INFERIOR DEFECT (COULD BE ATTENUATION)   CYSTOSCOPY WITH RETROGRADE PYELOGRAM, URETEROSCOPY AND STENT PLACEMENT Left 11/21/2012   Procedure: CYSTOSCOPY WITH RETROGRADE PYELOGRAM, URETEROSCOPY AND STENT PLACEMENT;  Surgeon: Lindaann Slough, MD;  Location: [facility];  Service: Urology;  Laterality: Left;   HOLMIUM LASER APPLICATION Left 11/21/2012   Procedure: HOLMIUM LASER APPLICATION;  Surgeon: Lindaann Slough, MD;  Location: [facility];  Service: Urology;  Laterality: Left;   INGUINAL HERNIA REPAIR Right 1997   TRANSTHORACIC ECHOCARDIOGRAM  04-03-2008   EF 45-50%/  MILD DIFFUSE LV HYPOKINESIS/  NO VALVE ABNORMALITIES    Social History   Socioeconomic History   Marital status: Married    Spouse name: Not on file   Number of children: Not on file   Years of education: Not on file   Highest education level: Not on file  Occupational History   Occupation: [employer]  Tobacco Use   Smoking status: Every Day    Current packs/day: 1.00    Average packs/day: 1 pack/day for 42.0 years (42.0 ttl pk-yrs)    Types: Cigarettes   Smokeless tobacco: Never  Vaping Use   Vaping status: Some Days  Substance and Sexual Activity   Alcohol use: Yes    Alcohol/week: 12.0 standard drinks of alcohol    Types: 12 Cans of beer per week   Drug use: No   Sexual activity: Not on file  Other Topics Concern   Not on file  Social History Narrative   Not on file   Social Drivers of Health   Financial Resource Strain: Not on file  Food Insecurity: Not on file  Transportation Needs: Not on file  Physical Activity: Not on  file  Stress: Not on file  Social Connections: Not on file  Intimate Partner Violence: Not on file    Family History  Problem Relation Age of Onset   Heart disease Father        deceased-- CHF   Colon polyps Neg Hx    Colon cancer Neg Hx    Rectal cancer Neg Hx    Stomach cancer Neg Hx     Allergies as of 03/30/2023   (No Known Allergies)    Current Outpatient Medications on File Prior to Encounter  Medication Sig Dispense Refill   apixaban (ELIQUIS) 5 MG TABS tablet Take 1 tablet (5 mg total) by mouth 2 (two) times daily. 60 tablet 1   nicotine (NICODERM CQ - DOSED IN MG/24 HOURS) 21 mg/24hr patch Place 1 patch (21 mg total) onto the skin daily. 28 patch 0   albuterol (VENTOLIN HFA) 108 (90 Base) MCG/ACT inhaler Inhale 2 puffs into the lungs every 4 (four) hours as needed for wheezing or shortness of breath. (Patient not taking: Reported on 03/30/2023) 6.7 g 0   alfuzosin (UROXATRAL) 10 MG 24 hr  tablet Take 1 tablet (10 mg total) by mouth daily with breakfast. NEEDS AN APPT FOR REFILLS (Patient not taking: Reported on 01/28/2023) 30 tablet 0   tadalafil (CIALIS) 20 MG tablet Take 20 mg by mouth as needed.     No current facility-administered medications on file prior to encounter.   REVIEW OF SYSTEMS:  Review of Systems  Respiratory:  Negative for shortness of breath.   Cardiovascular:  Negative for chest pain, palpitations and leg swelling.  Musculoskeletal:  Negative for myalgias.  Neurological:  Negative for dizziness and tingling.   PHYSICAL EXAMINATION:  Physical Exam Vitals reviewed.  Musculoskeletal:        General: No tenderness.     Right lower leg: Edema (trace) present.     Left lower leg: No edema.  Skin:    Findings: No erythema.   Villalta Score for Post-Thrombotic Syndrome: Pain: Mild Cramps: Mild Heaviness: Mild Paresthesia: Mild Pruritus: Moderate Pretibial Edema: Mild Skin Induration: Absent Hyperpigmentation: Absent Redness: Absent Venous  Ectasia: Absent Pain on calf compression: Absent Villalta Preliminary Score: 7 Is venous ulcer present?: No If venous ulcer is present and score is <15, then 15 points total are assigned: Absent Villalta Total Score: 7  LABS:  CBC     Component Value Date/Time   WBC 10.5 01/27/2023 2113   RBC 4.61 01/27/2023 2113   HGB 14.8 01/27/2023 2113   HCT 44.8 01/27/2023 2113   PLT 144 (L) 01/27/2023 2113   MCV 97.2 01/27/2023 2113   MCH 32.1 01/27/2023 2113   MCHC 33.0 01/27/2023 2113   RDW 12.0 01/27/2023 2113   LYMPHSABS 2.4 01/27/2023 2113   MONOABS 0.9 01/27/2023 2113   EOSABS 0.2 01/27/2023 2113   BASOSABS 0.0 01/27/2023 2113    Hepatic Function      Component Value Date/Time   PROT 7.7 08/30/2012 1232   ALBUMIN 4.2 08/30/2012 1232   AST 16 08/30/2012 1232   ALT 18 08/30/2012 1232   ALKPHOS 58 08/30/2012 1232   BILITOT 0.8 08/30/2012 1232   BILIDIR 0.1 08/30/2012 1232    Renal Function   Lab Results  Component Value Date   CREATININE 0.88 01/27/2023   CREATININE 1.1 08/30/2012   CREATININE 1.00 08/28/2012    CrCl cannot be calculated (Patient's most recent lab result is older than the maximum 21 days allowed.).   VVS Vascular Lab Studies:  01/28/23 VAS [place] LOWER EXTREMITY VENOUS (DVT)RIGHT  Summary:  RIGHT:  - Findings consistent with acute deep vein thrombosis involving the right common femoral vein, right femoral vein, right popliteal vein, right posterior tibial veins, right peroneal veins, and EIV.  - Findings consistent with acute intramuscular thrombosis involving the right gastrocnemius veins.    LEFT:  - No evidence of common femoral vein obstruction.   ASSESSMENT: Location of DVT: Right iliac vein, Right common femoral vein, Right femoral vein, Right popliteal vein, Right distal vein Cause of DVT: provoked by a transient risk factor  Patient with extensive iliofemoral DVT. No prior history of VTE. 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 Due to involvement of the common femoral and external iliac veins, discussed the patient with Dr. Lenell Antu at his initial visit in DVT Clinic. Given only partial occlusion in the common femoral and external iliac veins and the patient having no pain or swelling above the knee, no need for vascular  surgery intervention at this time. We have initiated anticoagulation with Eliquis, and Dr. Lenell Antu recommends treating for 3 months then switching back to low dose aspirin, and in the meantime patient needs to see his PCP for cancer screening. If he were to develop another DVT in the future, he would need anticoagulation indefinitely.   Patient continues to tolerate Eliquis well with significant improvement in RLE edema. Will continue current plan for 3 months of anticoagulation. Provided the last month's fill of Eliquis during his visit today from our pharmacy per patient request. No concerns for medication access or adherence at this time. Continued to discuss smoking cessation. Congratulated him on his success with decreasing from 1.5 ppd to 0.5 ppd. Continued motivational interviewing strategies to encourage complete cessation. Discussed trying to cut back on smoking while he is driving the garbage truck and trying to use nicotine lozenges instead of smoking cigarettes back to back. Suspect his symptoms of tachycardia/difficulty catching his breath with smoking multiple back to back since starting to wear the patch are from having too much nicotine in his system at once. He will work on spreading out his smoking to prevent this and also decrease total cigarettes/day. He is very motivated to quit in light of his DVT diagnosis. Will re-evaluate at his next visit.  PLAN: -Continue apixaban (Eliquis) 5 mg twice daily. -Expected duration of therapy: 3 months. Therapy started on 01/28/23. -Patient educated on purpose,  proper use and potential adverse effects of apixaban (Eliquis). -Discussed importance of taking medication around the same time every day. -Advised patient of medications to avoid (NSAIDs, aspirin doses >100 mg daily). -Educated that Tylenol (acetaminophen) is the preferred analgesic to lower the risk of bleeding. -Advised patient to alert all providers of anticoagulation therapy prior to starting a new medication or having a procedure. -Emphasized importance of monitoring for signs and symptoms of bleeding (abnormal bruising, prolonged bleeding, nose bleeds, bleeding from gums, discolored urine, black tarry stools). -Educated patient to present to the ED if emergent signs and symptoms of new thrombosis occur. -Counseled patient to wear compression stockings daily, removing at night. Continue elevating legs to help with swelling.   Follow up: 1 month in DVT Clinic for end of treatment visit.   Pervis Hocking, PharmD, [place], CPP Deep Vein Thrombosis Clinic Clinical Pharmacist Practitioner Office: [phone]  I have evaluated the patient's chart/imaging and refer this patient to the Clinical Pharmacist Practitioner for medication management. I have reviewed the CPP's documentation and agree with her assessment and plan. I was immediately available during the visit for questions and collaboration.   Durene Cal, MD

## 2023-04-13 ENCOUNTER — Encounter: Payer: Self-pay | Admitting: Gastroenterology
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
## 2023-04-21 ENCOUNTER — Ambulatory Visit (INDEPENDENT_AMBULATORY_CARE_PROVIDER_SITE_OTHER): Admitting: Ophthalmology

## 2023-04-21 ENCOUNTER — Encounter (INDEPENDENT_AMBULATORY_CARE_PROVIDER_SITE_OTHER): Payer: Self-pay | Admitting: Ophthalmology

## 2023-04-21 DIAGNOSIS — H25813 Combined forms of age-related cataract, bilateral: Secondary | ICD-10-CM

## 2023-04-21 DIAGNOSIS — H35033 Hypertensive retinopathy, bilateral: Secondary | ICD-10-CM

## 2023-04-21 DIAGNOSIS — I10 Essential (primary) hypertension: Secondary | ICD-10-CM | POA: Diagnosis not present

## 2023-04-21 DIAGNOSIS — H3581 Retinal edema: Secondary | ICD-10-CM

## 2023-04-21 DIAGNOSIS — H04123 Dry eye syndrome of bilateral lacrimal glands: Secondary | ICD-10-CM

## 2023-04-21 DIAGNOSIS — H43812 Vitreous degeneration, left eye: Secondary | ICD-10-CM

## 2023-04-27 ENCOUNTER — Ambulatory Visit (HOSPITAL_COMMUNITY): Payer: 59

## 2023-06-01 NOTE — Progress Notes (Signed)
 Chief Complaint: EGD/colonoscopy recall Primary GI MD: Dr. Elvin Hammer  HPI: 68 year old male history of hyperlipidemia, arthritis, Barrett's esophagus, BPH, nonischemic cardiomyopathy, GERD, carotid artery stenosis, DVT 01/2023 on Eliquis , presents for EGD/colonoscopy recall  Patient was seen in emergency department 01/27/2023 with complaints of right leg pain and positive D-dimer with concern for DVT.  Appears that due to staffing they were unable to perform DVT ultrasound.  He was discharged with the same DVT on Eliquis .  Discussed the use of AI scribe software for clinical note transcription with the patient, who gave verbal consent to proceed.  History of Present Illness He has been experiencing lower pelvic pain for about a week.  He has a history of urological procedures, including cystoscopy due to an enlarged prostate and chronic inflammation of the prostate and urethra.  He feels this pain is similar to when he had issues when he saw urology in the past  He reports urinary issues, including variable flow and occasional urgency. He sometimes experiences a 'sad blow' and has had an emergency where he needed to urinate and defecate simultaneously. He has not seen a urologist recently.  He has a history of Barrett's esophagus and previously took Zantac for acid reflux, which was discontinued. He occasionally takes Tums for acid build-up but reports not experiencing much reflux currently. He used to undergo regular endoscopies and colonoscopies to monitor his condition.  He was treated for a DVT in his leg in December and was on Eliquis , which he has since discontinued after three months. 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   PREVIOUS GI WORKUP   EGD 11/2010 for Barrett's esophagus - Barrett's esophagus in distal esophagus - Nodule GE junction - Multiple  erosions in the antrum - Duodenitis - Otherwise normal  Colonoscopy 05/2007 - 5 polyps removed ranging from 1 mm to 4 mm  COLON, ASCENDING, TRANSVERSE, SIGMOID AND DESCENDING, POLYPS:   - ONE ADENOMATOUS POLYP, FRAGMENTS OF HYPERPLASTIC POLYP AND   FRAGMENTS OF   BENIGN COLONIC MUCOSA.   - NO HIGH GRADE DYSPLASIA OR INVASIVE MALIGNANCY IDENTIFIED.   Past Medical History:  Diagnosis Date   Arthritis    Barrett esophagus    BPH (benign prostatic hypertrophy)    Frequency of urination    GERD (gastroesophageal reflux disease)    H/O hiatal hernia    History of prostatitis    HX RECURRENT   Hyperlipidemia    Left ureteral calculus    Nonischemic cardiomyopathy (HCC) PER PT ON 11-17-2012 STATED HAS TAKEN HIS COREG FOR A YEAR    LAST LOV TO CARDIOLOGIST  2010-  PCP DR Rosalia Colonel    Renal disorder    Stenosis of both internal carotid arteries    MILD--  0-39%  PER DUPLEX OF 2011   Urgency of urination     Past Surgical History:  Procedure Laterality Date   CARDIAC CATHETERIZATION  05-15-2008   MILD GLOBAL HYPOKINESIS, WORSE INFERIORLY/  MILD DILATED LVSF/  EF 40-45%   CARDIOVASCULAR STRESS TEST  04-03-2008   LOW RISK NUCLEAR STUDY/ EF 44%/ MILD HYPOKINESIS/ NO ISCHEMIA/  FIXED INFERIOR DEFECT (COULD BE ATTENUATION)   CYSTOSCOPY WITH RETROGRADE PYELOGRAM, URETEROSCOPY AND STENT PLACEMENT Left 11/21/2012   Procedure: CYSTOSCOPY WITH RETROGRADE PYELOGRAM, URETEROSCOPY AND STENT PLACEMENT;  Surgeon: Jinny Mounts, MD;  Location: [facility];  Service: Urology;  Laterality: Left;  HOLMIUM LASER APPLICATION Left 11/21/2012   Procedure: HOLMIUM LASER APPLICATION;  Surgeon: Jinny Mounts, MD;  Location: [facility] ;  Service: Urology;  Laterality: Left;   INGUINAL HERNIA REPAIR Right 1997   TRANSTHORACIC ECHOCARDIOGRAM  04-03-2008   EF 45-50%/  MILD DIFFUSE LV HYPOKINESIS/  NO VALVE ABNORMALITIES    Current Outpatient Medications  Medication Sig  Dispense Refill   albuterol  (VENTOLIN  HFA) 108 (90 Base) MCG/ACT inhaler Inhale 2 puffs into the lungs every 4 (four) hours as needed for wheezing or shortness of breath. 6.7 g 0   aspirin EC 81 MG tablet Take 81 mg by mouth daily. Swallow whole.     Na Sulfate-K Sulfate-Mg Sulfate concentrate (SUPREP) 17.5-3.13-1.6 GM/177ML SOLN Take 1 kit (354 mLs total) by mouth once for 1 dose. 354 mL 0   nicotine  (NICODERM CQ  - DOSED IN MG/24 HOURS) 21 mg/24hr patch Place 1 patch (21 mg total) onto the skin daily. 28 patch 0   tadalafil (CIALIS) 20 MG tablet Take 20 mg by mouth as needed.     No current facility-administered medications for this visit.    Allergies as of 06/02/2023   (No Known Allergies)    Family History  Problem Relation Age of Onset   Heart disease Father        deceased-- CHF   Colon polyps Neg Hx    Colon cancer Neg Hx    Rectal cancer Neg Hx    Stomach cancer Neg Hx     Social History   Socioeconomic History   Marital status: Married    Spouse name: Not on file   Number of children: Not on file   Years of education: Not on file   Highest education level: Not on file  Occupational History   Occupation: [employer]  Tobacco Use   Smoking status: Every Day    Current packs/day: 1.00    Average packs/day: 1 pack/day for 42.0 years (42.0 ttl pk-yrs)    Types: Cigarettes   Smokeless tobacco: Never  Vaping Use   Vaping status: Some Days  Substance and Sexual Activity   Alcohol use: Yes    Alcohol/week: 12.0 standard drinks of alcohol    Types: 12 Cans of beer per week   Drug use: No   Sexual activity: Not on file  Other Topics Concern   Not on file  Social History Narrative   Not on file   Social Drivers of Health   Financial Resource Strain: Not on file  Food Insecurity: Not on file  Transportation Needs: Not on file  Physical Activity: Not on file  Stress: Not on file  Social Connections: Not on file  Intimate Partner Violence: Not on file     Review of Systems:    Constitutional: No weight loss, fever, chills, weakness or fatigue HEENT: Eyes: No change in vision               Ears, Nose, Throat:  No change in hearing or congestion Skin: No rash or itching Cardiovascular: No chest pain, chest pressure or palpitations   Respiratory: No SOB or cough Gastrointestinal: See HPI and otherwise negative Genitourinary: No dysuria or change in urinary frequency Neurological: No headache, dizziness or syncope Musculoskeletal: No new muscle or joint pain Hematologic: No bleeding or bruising Psychiatric: No history of depression or anxiety    Physical Exam:  Vital signs: BP 128/66   Pulse 65   Ht 5\' 9"  (1.753 m)   Wt 216  lb (98 kg)   BMI 31.90 kg/m   Constitutional: NAD, alert and cooperative Head:  Normocephalic and atraumatic. Eyes:   PEERL, EOMI. No icterus. Conjunctiva pink. Respiratory: Respirations even and unlabored. Lungs clear to auscultation bilaterally.   No wheezes, crackles, or rhonchi.  Cardiovascular:  Regular rate and rhythm. No peripheral edema, cyanosis or pallor.  Gastrointestinal:  Soft, nondistended, nontender. No rebound or guarding. Normal bowel sounds. No appreciable masses or hepatomegaly. Rectal:  Declines Msk:  Symmetrical without gross deformities. Without edema, no deformity or joint abnormality.  Neurologic:  Alert and  oriented x4;  grossly normal neurologically.  Skin:   Dry and intact without significant lesions or rashes. Psychiatric: Oriented to person, place and time. Demonstrates good judgement and reason without abnormal affect or behaviors.  RELEVANT LABS AND IMAGING: CBC    Component Value Date/Time   WBC 10.5 01/27/2023 2113   RBC 4.61 01/27/2023 2113   HGB 14.8 01/27/2023 2113   HCT 44.8 01/27/2023 2113   PLT 144 (L) 01/27/2023 2113   MCV 97.2 01/27/2023 2113   MCH 32.1 01/27/2023 2113   MCHC 33.0 01/27/2023 2113   RDW 12.0 01/27/2023 2113   LYMPHSABS 2.4 01/27/2023 2113    MONOABS 0.9 01/27/2023 2113   EOSABS 0.2 01/27/2023 2113   BASOSABS 0.0 01/27/2023 2113    CMP     Component Value Date/Time   NA 138 01/27/2023 2113   K 4.0 01/27/2023 2113   CL 104 01/27/2023 2113   CO2 22 01/27/2023 2113   GLUCOSE 113 (H) 01/27/2023 2113   BUN 11 01/27/2023 2113   CREATININE 0.88 01/27/2023 2113   CALCIUM  9.3 01/27/2023 2113   PROT 7.7 08/30/2012 1232   ALBUMIN 4.2 08/30/2012 1232   AST 16 08/30/2012 1232   ALT 18 08/30/2012 1232   ALKPHOS 58 08/30/2012 1232   BILITOT 0.8 08/30/2012 1232   GFRNONAA >60 01/27/2023 2113   GFRAA 101 03/27/2008 1221     Assessment/Plan:   History of Barrett's esophagus EGD in 2012 for GERD showed Barrett's in distal esophagus, duodenitis, multiple erosions in antrum.  No recent follow-up EGD.  Very rare GERD at this time well-controlled with Tums.  Recent DVT 01/2023 no longer on anticoagulation. - Due for surveillance EGD, will schedule for June.  Suspect patient is an LEC candidate but will confirm with Rogena Class, CRNA - I thoroughly discussed the procedure with the patient (at bedside) to include nature of the procedure, alternatives, benefits, and risks (including but not limited to bleeding, infection, perforation, anesthesia/cardiac pulmonary complications).  Patient verbalized understanding and gave verbal consent to proceed with procedure.  History of colon polyps 1 tubular adenoma on colonoscopy in 2009.  Due for repeat.  No GI issues at this time. - Schedule colonoscopy - Advised of risks versus benefits as above  History of DVT DVT 01/27/2023, placed on Eliquis  for 3 months.  Currently no longer on Eliquis .  No recurrent issues.  Recommend waiting 6 months prior to procedures (will schedule procedures for June 2025)  Pelvic pain Dysuria History of BPH Previously followed by urology with history of cystoscopy, chronic prostatitis, urethritis.  Recurrent GU symptoms.  Has not seen urology in years. - Refer to  urology  Suzanna Erp, PA-C [facility] 06/02/2023, 10:55 AM  Cc: [facility]*

## 2023-06-02 ENCOUNTER — Telehealth: Payer: Self-pay

## 2023-06-02 ENCOUNTER — Encounter: Payer: Self-pay | Admitting: Gastroenterology

## 2023-06-02 ENCOUNTER — Ambulatory Visit: Admitting: Gastroenterology

## 2023-06-02 VITALS — BP 128/66 | HR 65 | Ht 69.0 in | Wt 216.0 lb

## 2023-06-02 DIAGNOSIS — N401 Enlarged prostate with lower urinary tract symptoms: Secondary | ICD-10-CM

## 2023-06-02 DIAGNOSIS — K219 Gastro-esophageal reflux disease without esophagitis: Secondary | ICD-10-CM

## 2023-06-02 DIAGNOSIS — K2270 Barrett's esophagus without dysplasia: Secondary | ICD-10-CM

## 2023-06-02 DIAGNOSIS — R102 Pelvic and perineal pain: Secondary | ICD-10-CM | POA: Diagnosis not present

## 2023-06-02 DIAGNOSIS — R300 Dysuria: Secondary | ICD-10-CM | POA: Diagnosis not present

## 2023-06-02 DIAGNOSIS — Z860100 Personal history of colon polyps, unspecified: Secondary | ICD-10-CM

## 2023-06-02 DIAGNOSIS — Z8719 Personal history of other diseases of the digestive system: Secondary | ICD-10-CM

## 2023-06-02 DIAGNOSIS — Z860101 Personal history of adenomatous and serrated colon polyps: Secondary | ICD-10-CM

## 2023-06-02 MED ORDER — NA SULFATE-K SULFATE-MG SULF 17.5-3.13-1.6 GM/177ML PO SOLN: 1.0000 | Freq: Once | ORAL | 0 refills | Status: AC

## 2023-06-02 NOTE — Patient Instructions (Signed)
 We have sent a referral to Urology, they will call you to set up an appointment.  You have been scheduled for a colonoscopy. Please follow written instructions given to you at your visit today.   If you use inhalers (even only as needed), please bring them with you on the day of your procedure.  DO NOT TAKE 7 DAYS PRIOR TO TEST- Trulicity (dulaglutide) Ozempic, Wegovy (semaglutide) Mounjaro (tirzepatide) Bydureon Bcise (exanatide extended release)  DO NOT TAKE 1 DAY PRIOR TO YOUR TEST Rybelsus (semaglutide) Adlyxin (lixisenatide) Victoza (liraglutide) Byetta (exanatide) ___________________________________________________________________________  Please follow up sooner if symptoms increase or worsen   Due to recent changes in healthcare laws, you may see the results of your imaging and laboratory studies on MyChart before your provider has had a chance to review them.  We understand that in some cases there may be results that are confusing or concerning to you. Not all laboratory results come back in the same time frame and the provider may be waiting for multiple results in order to interpret others.  Please give us  48 hours in order for your provider to thoroughly review all the results before contacting the office for clarification of your results.   _______________________________________________________  If your blood pressure at your visit was 140/90 or greater, please contact your primary care physician to follow up on this.  _______________________________________________________  If you are age 97 or older, your body mass index should be between 23-30. Your Body mass index is 31.9 kg/m. If this is out of the aforementioned range listed, please consider follow up with your Primary Care Provider.  If you are age 79 or younger, your body mass index should be between 19-25. Your Body mass index is 31.9 kg/m. If this is out of the aformentioned range listed, please consider  follow up with your Primary Care Provider.   ________________________________________________________  The [facility] providers would like to encourage you to use MYCHART to communicate with providers for non-urgent requests or questions.  Due to long hold times on the telephone, sending your provider a message by [facility] may be a faster and more efficient way to get a response.  Please allow 48 business hours for a response.  Please remember that this is for non-urgent requests.  _______________________________________________________ Thank you for trusting me with your gastrointestinal care!   Suzanna Erp, PA

## 2023-06-02 NOTE — Progress Notes (Signed)
 Noted.

## 2023-06-03 NOTE — Telephone Encounter (Signed)
 Clearance has been faxed.

## 2023-06-08 ENCOUNTER — Encounter (INDEPENDENT_AMBULATORY_CARE_PROVIDER_SITE_OTHER): Admitting: Ophthalmology

## 2023-06-08 DIAGNOSIS — H35033 Hypertensive retinopathy, bilateral: Secondary | ICD-10-CM

## 2023-06-08 DIAGNOSIS — H43812 Vitreous degeneration, left eye: Secondary | ICD-10-CM

## 2023-06-08 DIAGNOSIS — I10 Essential (primary) hypertension: Secondary | ICD-10-CM

## 2023-06-08 DIAGNOSIS — H04123 Dry eye syndrome of bilateral lacrimal glands: Secondary | ICD-10-CM

## 2023-06-08 DIAGNOSIS — H25813 Combined forms of age-related cataract, bilateral: Secondary | ICD-10-CM

## 2023-06-09 NOTE — Progress Notes (Signed)
 [facility] - Clinic Note  06/15/2023   CHIEF COMPLAINT Patient presents for Retina Follow Up  HISTORY OF PRESENT ILLNESS: Jeffery Frye is a 68 y.o. male who presents to the clinic today for:  HPI     Retina Follow Up   In left eye.  This started 8.  Duration of 8 weeks.  Since onset it is stable.  I, the attending physician,  performed the HPI with the patient and updated documentation appropriately.        Comments   8 week retina follow up PVD OS pt is reporting no vision changes he is still having some flashes and floaters       Last edited by Ronelle Coffee, MD on 06/19/2023 11:59 PM.    Pt states he has been seeing the eye drs at [facility] in [place], he states at night he sees fol out of the corners of his eyes, he does not drive at night  Referring physician: [facility] [address] [place],  [place] [address]  HISTORICAL INFORMATION:  Selected notes from the MEDICAL RECORD NUMBER Referred by Dr. Varadarajan for floaters OS LEE:  Ocular Hx- PMH-   CURRENT MEDICATIONS: No current outpatient medications on file. (Ophthalmic Drugs)   No current facility-administered medications for this visit. (Ophthalmic Drugs)   Current Outpatient Medications (Other)  Medication Sig   albuterol  (VENTOLIN  HFA) 108 (90 Base) MCG/ACT inhaler Inhale 2 puffs into the lungs every 4 (four) hours as needed for wheezing or shortness of breath.   aspirin EC 81 MG tablet Take 81 mg by mouth daily. Swallow whole.   nicotine  (NICODERM CQ  - DOSED IN MG/24 HOURS) 21 mg/24hr patch Place 1 patch (21 mg total) onto the skin daily.   tadalafil (CIALIS) 20 MG tablet Take 20 mg by mouth as needed.   No current facility-administered medications for this visit. (Other)   REVIEW OF SYSTEMS: ROS   Positive for: Eyes Negative for: Constitutional, Gastrointestinal, Skin, Genitourinary, HENT, Allergic/Imm Last edited by Alise Appl, COT on 06/15/2023  9:21 AM.      ALLERGIES No Known Allergies PAST MEDICAL HISTORY Past Medical History:  Diagnosis Date   Arthritis    Barrett esophagus    BPH (benign prostatic hypertrophy)    Frequency of urination    GERD (gastroesophageal reflux disease)    H/O hiatal hernia    History of prostatitis    HX RECURRENT   Hyperlipidemia    Left ureteral calculus    Nonischemic cardiomyopathy (HCC) PER PT ON 11-17-2012 STATED HAS TAKEN HIS COREG FOR A YEAR    LAST LOV TO CARDIOLOGIST  2010-  PCP DR Rosalia Colonel    Renal disorder    Stenosis of both internal carotid arteries    MILD--  0-39%  PER DUPLEX OF 2011   Urgency of urination    Past Surgical History:  Procedure Laterality Date   CARDIAC CATHETERIZATION  05-15-2008   MILD GLOBAL HYPOKINESIS, WORSE INFERIORLY/  MILD DILATED LVSF/  EF 40-45%   CARDIOVASCULAR STRESS TEST  04-03-2008   LOW RISK NUCLEAR STUDY/ EF 44%/ MILD HYPOKINESIS/ NO ISCHEMIA/  FIXED INFERIOR DEFECT (COULD BE ATTENUATION)   CYSTOSCOPY WITH RETROGRADE PYELOGRAM, URETEROSCOPY AND STENT PLACEMENT Left 11/21/2012   Procedure: CYSTOSCOPY WITH RETROGRADE PYELOGRAM, URETEROSCOPY AND STENT PLACEMENT;  Surgeon: Jinny Mounts, MD;  Location: [facility];  Service: Urology;  Laterality: Left;   HOLMIUM  LASER APPLICATION Left 11/21/2012   Procedure: HOLMIUM LASER APPLICATION;  Surgeon: Jinny Mounts, MD;  Location: [facility];  Service: Urology;  Laterality: Left;   INGUINAL HERNIA REPAIR Right 1997   TRANSTHORACIC ECHOCARDIOGRAM  04-03-2008   EF 45-50%/  MILD DIFFUSE LV HYPOKINESIS/  NO VALVE ABNORMALITIES   FAMILY HISTORY Family History  Problem Relation Age of Onset   Heart disease Father        deceased-- CHF   Colon polyps Neg Hx    Colon cancer Neg Hx    Rectal cancer Neg Hx    Stomach cancer Neg Hx    SOCIAL HISTORY Social History   Tobacco Use   Smoking status: Every Day    Current packs/day: 1.00     Average packs/day: 1 pack/day for 42.0 years (42.0 ttl pk-yrs)    Types: Cigarettes   Smokeless tobacco: Never  Vaping Use   Vaping status: Some Days  Substance Use Topics   Alcohol use: Yes    Alcohol/week: 12.0 standard drinks of alcohol    Types: 12 Cans of beer per week   Drug use: No       OPHTHALMIC EXAM:  Base Eye Exam     Visual Acuity (Snellen - Linear)       Right Left   Dist [place] 20/25 -1 20/25 -3   Dist ph [place] NI NI         Tonometry (Tonopen, 9:26 AM)       Right Left   Pressure 18 18         Pupils       Pupils Dark Light Shape React APD   Right PERRL 4 3 Round Brisk None   Left PERRL 4 3 Round Brisk None         Visual Fields       Left Right    Full Full         Extraocular Movement       Right Left    Full, Ortho Full, Ortho         Neuro/Psych     Oriented x3: Yes   Mood/Affect: Normal         Dilation     Both eyes: 2.5% Phenylephrine @ 9:26 AM           Slit Lamp and Fundus Exam     External Exam       Right Left   External Normal Normal         Slit Lamp Exam       Right Left   Lids/Lashes dermatochalasis dermatochalasis   Conjunctiva/Sclera mild melanosis, nasal and temporal pinguecula mild melanosis, nasal pingeucula   Cornea arcus arcus, trace tear film debris   Anterior Chamber deep, narrow angles deep, narrow angles   Iris round and dilated round and dilated   Lens 2-3+ Nuclear sclerosis, 2-3+ cortical 2-3+ Nuclear sclerosis, 2-3+ cortical   Anterior Vitreous syneresis, low PVD / Weiss ring mild syneresis, PVD         Fundus Exam       Right Left   Disc pink and sharp pink and sharp, mild PPP   C/D Ratio 0.5 0.6   Macula flat, good foveal reflex, no heme or edema flat, good foveal reflex, mild RPE mottling, no heme or edema   Vessels attenuated, mild tortuosity attenuated, mild tortuosity   Periphery attached, no heme, no RT/RD attached, no heme, no RT/RD  IMAGING AND  PROCEDURES  Imaging and Procedures for 06/15/2023  OCT, Retina - OU - Both Eyes        Right Eye Quality was good. Central Foveal Thickness: 265. Progression has been stable. Findings include normal foveal contour, no IRF, no SRF (Interval release of VMA to full PVD).   Left Eye Quality was good. Central Foveal Thickness: 297. Progression has been stable. Findings include normal foveal contour, no IRF, no SRF.   Notes  *Images captured and stored on drive  Diagnosis / Impression:  NFP, no IRF/SRF OU OD: Interval release of VMA to full PVD    Clinical management:  See below  Abbreviations: NFP - Normal foveal profile. CME - cystoid macular edema. PED - pigment epithelial detachment. IRF - intraretinal fluid. SRF - subretinal fluid. EZ - ellipsoid zone. ERM - epiretinal membrane. ORA - outer retinal atrophy. ORT - outer retinal tubulation. SRHM - subretinal hyper-reflective material. IRHM - intraretinal hyper-reflective material           ASSESSMENT/PLAN:   ICD-10-CM   1. Posterior vitreous detachment of both eyes  H43.813 OCT, Retina - OU - Both Eyes    2. Essential hypertension  I10     3. Hypertensive retinopathy of both eyes  H35.033     4. Combined forms of age-related cataract of both eyes  H25.813     5. Dry eyes  H04.123      1. PVD / vitreous syneresis OU  - pt with symptomatic floaters OS since January; denies photopsias  - OCT OD shows interval release of VMA to full PVD  - Discussed findings and prognosis  - No RT or RD on 360 peripheral exam OU  - Reviewed s/s of RT/RD  - Strict return precautions for any such RT/RD signs/symptoms  - f/u PRN  2,3. Hypertensive retinopathy OU - discussed importance of tight BP control - monitor  4. Mixed cataracts OU - The symptoms of cataract, surgical options, and treatments and risks  were discussed with patients     - discussed diagnosis and progression - monitor for now  5. Dry eyes OU - recommend  artificial tears and lubricating ointment as needed   **will refer to Dr. Micael Adas to establish a primary eye dr**  Ophthalmic Meds Ordered this visit:  No orders of the defined types were placed in this encounter.    Return if symptoms worsen or fail to improve.  There are no Patient Instructions on file for this visit.  Explained the diagnoses, plan, and follow up with the patient and they expressed understanding.  Patient expressed understanding of the importance of proper follow up care.   This document serves as a record of services personally performed by Jeanice Millard, MD, PhD. It was created on their behalf by Angelia Kelp, an ophthalmic technician. The creation of this record is the provider's dictation and/or activities during the visit.    Electronically signed by: Angelia Kelp, OA, 06/20/23  12:00 AM  This document serves as a record of services personally performed by Jeanice Millard, MD, PhD. It was created on their behalf by Morley Arabia. Bevin Bucks, OA an ophthalmic technician. The creation of this record is the provider's dictation and/or activities during the visit.    Electronically signed by: Morley Arabia. Bevin Bucks, OA 06/20/23 12:00 AM  Jeanice Millard, M.D., Ph.D. Diseases & Surgery of the Retina and Vitreous [facility] 06/15/2023  I have reviewed the above documentation for  accuracy and completeness, and I agree with the above. Jeanice Millard, M.D., Ph.D. 06/20/23 12:03 AM   Abbreviations: M myopia (nearsighted); A astigmatism; H hyperopia (farsighted); P presbyopia; Mrx spectacle prescription;  CTL contact lenses; OD right eye; OS left eye; OU both eyes  XT exotropia; ET esotropia; PEK punctate epithelial keratitis; PEE punctate epithelial erosions; DES dry eye syndrome; MGD meibomian gland dysfunction; ATs artificial tears; PFAT's preservative free artificial tears; NSC nuclear sclerotic cataract; PSC posterior subcapsular cataract; ERM  epi-retinal membrane; PVD posterior vitreous detachment; RD retinal detachment; DM diabetes mellitus; DR diabetic retinopathy; NPDR non-proliferative diabetic retinopathy; PDR proliferative diabetic retinopathy; CSME clinically significant macular edema; DME diabetic macular edema; dbh dot blot hemorrhages; CWS cotton wool spot; POAG primary open angle glaucoma; C/D cup-to-disc ratio; HVF humphrey visual field; GVF goldmann visual field; OCT optical coherence tomography; IOP intraocular pressure; BRVO Branch retinal vein occlusion; CRVO central retinal vein occlusion; CRAO central retinal artery occlusion; BRAO branch retinal artery occlusion; RT retinal tear; SB scleral buckle; PPV pars plana vitrectomy; VH Vitreous hemorrhage; PRP panretinal laser photocoagulation; IVK intravitreal kenalog ; VMT vitreomacular traction; MH Macular hole;  NVD neovascularization of the disc; NVE neovascularization elsewhere; AREDS age related eye disease study; ARMD age related macular degeneration; POAG primary open angle glaucoma; EBMD epithelial/anterior basement membrane dystrophy; ACIOL anterior chamber intraocular lens; IOL intraocular lens; PCIOL posterior chamber intraocular lens; Phaco/IOL phacoemulsification with intraocular lens placement; PRK photorefractive keratectomy; LASIK laser assisted in situ keratomileusis; HTN hypertension; DM diabetes mellitus; COPD chronic obstructive pulmonary disease

## 2023-06-15 ENCOUNTER — Ambulatory Visit (INDEPENDENT_AMBULATORY_CARE_PROVIDER_SITE_OTHER): Admitting: Ophthalmology

## 2023-06-15 ENCOUNTER — Encounter (INDEPENDENT_AMBULATORY_CARE_PROVIDER_SITE_OTHER): Payer: Self-pay | Admitting: Ophthalmology

## 2023-06-15 DIAGNOSIS — H43812 Vitreous degeneration, left eye: Secondary | ICD-10-CM

## 2023-06-15 DIAGNOSIS — H35033 Hypertensive retinopathy, bilateral: Secondary | ICD-10-CM

## 2023-06-15 DIAGNOSIS — H04123 Dry eye syndrome of bilateral lacrimal glands: Secondary | ICD-10-CM

## 2023-06-15 DIAGNOSIS — H25813 Combined forms of age-related cataract, bilateral: Secondary | ICD-10-CM

## 2023-06-15 DIAGNOSIS — I10 Essential (primary) hypertension: Secondary | ICD-10-CM | POA: Diagnosis not present

## 2023-06-15 DIAGNOSIS — H43813 Vitreous degeneration, bilateral: Secondary | ICD-10-CM

## 2023-06-17 ENCOUNTER — Telehealth: Payer: Self-pay | Admitting: Gastroenterology

## 2023-06-17 DIAGNOSIS — I429 Cardiomyopathy, unspecified: Secondary | ICD-10-CM

## 2023-06-17 NOTE — Telephone Encounter (Signed)
 Reviewed fax from Dr. Varadarajan [facility])  Dr. Beauty Bourbon states "we do not know of cardiomyopathy - patient is medically cleared for procedure"  Please proceed with scheduling EGD/Colonoscopy per previous note for June 2025 with Dr. Elvin Hammer. Thank you.

## 2023-06-19 ENCOUNTER — Encounter (INDEPENDENT_AMBULATORY_CARE_PROVIDER_SITE_OTHER): Payer: Self-pay | Admitting: Ophthalmology

## 2023-06-22 NOTE — Addendum Note (Signed)
 Addended by: Trentyn Boisclair E on: 06/22/2023 12:18 PM   Modules accepted: Orders

## 2023-06-22 NOTE — Telephone Encounter (Signed)
 I spoke with Jeffery Frye and he said he doesn't have a cardiologist. Looks like years ago he did. I have placed an ambulatory referral to cariology. I told him they will reach out to him. He is aware that we may have to postpone his ECL set up for 07/21/2023 with Dr Elvin Hammer.

## 2023-07-12 NOTE — Progress Notes (Signed)
 07/13/2023 8:14 AM   Jeffery Frye 1955-02-04 [id number]   CC:   HPI:    PMH: Past Medical History:  Diagnosis Date   Arthritis    Barrett esophagus    BPH (benign prostatic hypertrophy)    Frequency of urination    GERD (gastroesophageal reflux disease)    H/O hiatal hernia    History of prostatitis    HX RECURRENT   Hyperlipidemia    Left ureteral calculus    Nonischemic cardiomyopathy (HCC) PER PT ON 11-17-2012 STATED HAS TAKEN HIS COREG FOR A YEAR    LAST LOV TO CARDIOLOGIST  2010-  PCP DR Rosalia Colonel    Renal disorder    Stenosis of both internal carotid arteries    MILD--  0-39%  PER DUPLEX OF 2011   Urgency of urination     Surgical History: Past Surgical History:  Procedure Laterality Date   CARDIAC CATHETERIZATION  05-15-2008   MILD GLOBAL HYPOKINESIS, WORSE INFERIORLY/  MILD DILATED LVSF/  EF 40-45%   CARDIOVASCULAR STRESS TEST  04-03-2008   LOW RISK NUCLEAR STUDY/ EF 44%/ MILD HYPOKINESIS/ NO ISCHEMIA/  FIXED INFERIOR DEFECT (COULD BE ATTENUATION)   CYSTOSCOPY WITH RETROGRADE PYELOGRAM, URETEROSCOPY AND STENT PLACEMENT Left 11/21/2012   Procedure: CYSTOSCOPY WITH RETROGRADE PYELOGRAM, URETEROSCOPY AND STENT PLACEMENT;  Surgeon: Jinny Mounts, MD;  Location: [facility] [place];  Service: Urology;  Laterality: Left;   HOLMIUM LASER APPLICATION Left 11/21/2012   Procedure: HOLMIUM LASER APPLICATION;  Surgeon: Jinny Mounts, MD;  Location: [facility] [place];  Service: Urology;  Laterality: Left;   INGUINAL HERNIA REPAIR Right 1997   TRANSTHORACIC ECHOCARDIOGRAM  04-03-2008   EF 45-50%/  MILD DIFFUSE LV HYPOKINESIS/  NO VALVE ABNORMALITIES    Home Medications:  Allergies as of 07/13/2023   No Known Allergies      Medication List        Accurate as of July 12, 2023  8:14 AM. If you have any questions, ask your nurse or doctor.          albuterol  108 (90 Base) MCG/ACT inhaler Commonly known as: VENTOLIN  HFA Inhale 2  puffs into the lungs every 4 (four) hours as needed for wheezing or shortness of breath.   aspirin EC 81 MG tablet Take 81 mg by mouth daily. Swallow whole.   nicotine  21 mg/24hr patch Commonly known as: NICODERM CQ  - dosed in mg/24 hours Place 1 patch (21 mg total) onto the skin daily.   tadalafil 20 MG tablet Commonly known as: CIALIS Take 20 mg by mouth as needed.        Allergies: No Known Allergies  Family History: Family History  Problem Relation Age of Onset   Heart disease Father        deceased-- CHF   Colon polyps Neg Hx    Colon cancer Neg Hx    Rectal cancer Neg Hx    Stomach cancer Neg Hx     Social History:  reports that he has been smoking cigarettes. He has a 42 pack-year smoking history. He has never used smokeless tobacco. He reports current alcohol use of about 12.0 standard drinks of alcohol per week. He reports that he does not use drugs.  ROS: All other review of systems were reviewed and are negative except what is noted above in HPI  Physical Exam: There were no vitals taken for this visit.  Constitutional:  Alert and oriented, No acute distress. HEENT: [place] AT, moist mucus membranes.  Trachea midline, no masses. Cardiovascular: No clubbing, cyanosis, or edema. Respiratory: Normal respiratory effort, no increased work of breathing. GI: No inguinal hernias GU: Normal phallus. No masses/lesions on penis, testis, scrotum. Prostate ***g smooth no nodules no induration.  Lymph: No cervical or inguinal lymphadenopathy. Skin: No rashes, bruises or suspicious lesions. Neurologic: Grossly intact, no focal deficits, moving all 4 extremities. Psychiatric: Normal mood and affect.  Laboratory Data: Lab Results  Component Value Date   WBC 10.5 01/27/2023   HGB 14.8 01/27/2023   HCT 44.8 01/27/2023   MCV 97.2 01/27/2023   PLT 144 (L) 01/27/2023    Lab Results  Component Value Date   CREATININE 0.88 01/27/2023    Lab Results  Component Value Date    PSA 0.69 06/07/2007    No results found for: "TESTOSTERONE"  No results found for: "HGBA1C"  Urinalysis   Pertinent Imaging: ***  Assessment & Plan:    There are no diagnoses linked to this encounter.

## 2023-07-13 ENCOUNTER — Ambulatory Visit: Admitting: Urology

## 2023-07-13 ENCOUNTER — Encounter: Payer: Self-pay | Admitting: Cardiology

## 2023-07-13 ENCOUNTER — Encounter: Payer: Self-pay | Admitting: Urology

## 2023-07-13 ENCOUNTER — Ambulatory Visit: Attending: Cardiology | Admitting: Cardiology

## 2023-07-13 VITALS — BP 112/78 | HR 74 | Ht 69.0 in | Wt 217.2 lb

## 2023-07-13 VITALS — BP 113/73 | HR 81 | Ht 69.0 in | Wt 217.0 lb

## 2023-07-13 DIAGNOSIS — E785 Hyperlipidemia, unspecified: Secondary | ICD-10-CM

## 2023-07-13 DIAGNOSIS — Z01810 Encounter for preprocedural cardiovascular examination: Secondary | ICD-10-CM

## 2023-07-13 DIAGNOSIS — I420 Dilated cardiomyopathy: Secondary | ICD-10-CM | POA: Diagnosis not present

## 2023-07-13 DIAGNOSIS — N401 Enlarged prostate with lower urinary tract symptoms: Secondary | ICD-10-CM

## 2023-07-13 DIAGNOSIS — N138 Other obstructive and reflux uropathy: Secondary | ICD-10-CM

## 2023-07-13 DIAGNOSIS — I428 Other cardiomyopathies: Secondary | ICD-10-CM

## 2023-07-13 DIAGNOSIS — F17200 Nicotine dependence, unspecified, uncomplicated: Secondary | ICD-10-CM | POA: Diagnosis not present

## 2023-07-13 DIAGNOSIS — I2510 Atherosclerotic heart disease of native coronary artery without angina pectoris: Secondary | ICD-10-CM

## 2023-07-13 LAB — MICROSCOPIC EXAMINATION

## 2023-07-13 LAB — URINALYSIS, ROUTINE W REFLEX MICROSCOPIC
Bilirubin, UA: NEGATIVE
Glucose, UA: NEGATIVE
Leukocytes,UA: NEGATIVE
Nitrite, UA: NEGATIVE
Protein,UA: NEGATIVE
RBC, UA: NEGATIVE
Specific Gravity, UA: 1.025 (ref 1.005–1.030)
Urobilinogen, Ur: 0.2 mg/dL (ref 0.2–1.0)
pH, UA: 5.5 (ref 5.0–7.5)

## 2023-07-13 LAB — BLADDER SCAN AMB NON-IMAGING: Scan Result: 50

## 2023-07-13 MED ORDER — ALFUZOSIN HCL ER 10 MG PO TB24: 10.0000 mg | ORAL_TABLET | Freq: Every day | ORAL | 11 refills | Status: AC

## 2023-07-13 NOTE — Patient Instructions (Addendum)
 Medication Instructions:   No changes *If you need a refill on your cardiac medications before your next appointment, please call your pharmacy*   Lab Work: Not needed    Testing/Procedures:  Your physician has requested that you have an echocardiogram. Echocardiography is a painless test that uses sound waves to create images of your heart. It provides your doctor with information about the size and shape of your heart and how well your heart's chambers and valves are working. This procedure takes approximately one hour. There are no restrictions for this procedure. Please do NOT wear cologne, perfume, aftershave, or lotions (deodorant is allowed). Please arrive 15 minutes prior to your appointment time.  Please note: We ask at that you not bring children with you during ultrasound (echo/ vascular) testing. Due to room size and safety concerns, children are not allowed in the ultrasound rooms during exams. Our front office staff cannot provide observation of children in our lobby area while testing is being conducted. An adult accompanying a patient to their appointment will only be allowed in the ultrasound room at the discretion of the ultrasound technician under special circumstances. We apologize for any inconvenience.    Follow-Up: At [facility], you and your health needs are our priority.  As part of our continuing mission to provide you with exceptional heart care, we have created designated Provider Care Teams.  These Care Teams include your primary Cardiologist (physician) and Advanced Practice Providers (APPs -  Physician Assistants and Nurse Practitioners) who all work together to provide you with the care you need, when you need it.     Your next appointment:   6 month(s) ( if echo is normal- follow up as needed)   The format for your next appointment:   In Person  Provider:   Randene Bustard, MD   Other Instructions   You are cleared from a cardiac standpoint to  have colonoscopy

## 2023-07-13 NOTE — Progress Notes (Signed)
 Cardiology Office Note:  .   Date:  07/15/2023  ID:  Jeffery Frye, DOB Jan 30, 1956, MRN [id number] PCP: [facility] Dr. [facility] Providers Cardiologist:  Randene Bustard, MD     Chief Complaint  Patient presents with   New Patient (Initial Visit)    History of cardiomyopathy.  Preop assessment for colonoscopy.   Cardiomyopathy    Diagnosed in 2010.  Lost to follow-up    Patient Profile: .     Jeffery Frye is a mildly obese 68 y.o. male smoker with a PMH notable for distant history of Nonischemic Cardiomyopathy, thrombophilia with history of DVT (no longer on Eliquis ) who presents here to establish Cardiology Care - for Cardiovascular Clearance for upcoming Colonoscopy at the request of [facility] Dr. Tereasa Felty was seen by Dr. Peder Bourdon in April 2020 for nonischemic cardiomyopathy of uncertain etiology.  EF by echo was 45%-Global HK.  ETT Myoview showed EF 44% with fixed inferior defect thought to be diaphragmatic attenuation.  Apparently his father died of CHF of unknown etiology.  Plan was for cardiac cath and possible cardiac MRI.  Was started on Coreg 6.25 mg twice daily.  HIV, ANA, SPEP all ordered.  Also LDL was 156.  Was started on statin.  Cardiac cath showed no significant CAD.  No further notes after that point. =NO longer on meds.     Subjective  Discussed the use of AI scribe software for clinical note transcription with the patient, who gave verbal consent to proceed.  History of Present Illness  Jeffery Frye is a 68 year old male with cardiomyopathy who presents for cardiology evaluation prior to a colonoscopy. He was referred for cardiology evaluation due to a prior diagnosis of cardiomyopathy noted during preoperative assessment for a colonoscopy.  He has a history of cardiomyopathy diagnosed in 2010, with an echocardiogram showing low pump function and a heart catheterization  that revealed no blockages. He was initially started on medications but discontinued them due to side effects and has not been on any medications for blood pressure or cholesterol since then.  He experiences rare episodes where he feels like he might pass out, describing it as 'everything goes spatially like I'm gonna pass out, but I don't.' These episodes last about half a second and have occurred six times in his lifetime.  He says they are not associated with, nor has he had any symptoms of heart racing or palpitations.  He experiences shortness of breath after climbing 28 stairs, requiring a pause before continuing, however he indicates that this is longstanding symptom that has not changed.  Other than walk around at work, he really does not exercise.Jeffery Frye No chest tightness, pressure, or pain during daily activities or while walking on flat ground. He does not experience shortness of breath when lying down or waking up at night.  He has a history of a blood clot in his right knee, for which he was treated with anticoagulation for three months. He currently wears compression socks.  He is a current smoker, smoking about a pack and a half per day, and reports stopping alcohol consumption four days ago. He previously drank a beer or two every other day.  His family history is significant for heart disease; his father died of congestive heart failure at 23, and his mother died of a blood clot at 55.  Cardiovascular ROS: positive for - dyspnea on exertion and and  very fleeting episodes that are rare of near syncope.  Exertional dyspnea is with more vigorous exertion such as rapidly climbing up 28 stairs. negative for - chest pain, edema, irregular heartbeat, orthopnea, palpitations, rapid heart rate, shortness of breath, or syncope or TIA/amaurosis fugax.  Claudication    Objective   Family History:    Father with CHF, COPD.  Father died from CHF of uncertain etiology at age 30.     Mother with  palpitations, died from PE  Current Meds  Medication Sig   aspirin EC 81 MG tablet Take 81 mg by mouth daily. Swallow whole.   tadalafil (CIALIS) 20 MG tablet Take 20 mg by mouth as needed.    Studies Reviewed: Jeffery Frye   EKG Interpretation Date/Time:  Wednesday July 13 2023 09:34:52 EDT Ventricular Rate:  74 PR Interval:  150 QRS Duration:  80 QT Interval:  368 QTC Calculation: 408 R Axis:   4  Text Interpretation: Normal sinus rhythm Normal ECG When compared with ECG of 21-Feb-2023 11:56, PREVIOUS ECG IS PRESENT Confirmed by Randene Bustard ([id number]) on 07/13/2023 9:45:41 AM    Labs 04/13/2023: TC 178, TG 158, HDL 40, LDL 108.  Hgb 13.6.  -  Echo (3/10):  EF 45%, mild diffuse hypokinesis, no significant valvular abnormalities.  - ETT-myoview (3/10):  8'1 exercise, 93% MPHR, 10.2 METS, EF 44%, mild diffuse hypokinesis.  There is a fixed inferior defect that may be diaphragmatic attentuation.  - Cardiac Cath (05/15/2008): EF 40 to 45%.  Right dominant coronary system with no angiographic CAD.  Risk Assessment/Calculations:         Physical Exam:   VS:  BP 112/78 (BP Location: Left Arm, Patient Position: Sitting)   Pulse 74   Ht 5' 9 (1.753 m)   Wt 217 lb 3.2 oz (98.5 kg)   SpO2 96%   BMI 32.07 kg/m    Wt Readings from Last 3 Encounters:  07/13/23 217 lb (98.4 kg)  07/13/23 217 lb 3.2 oz (98.5 kg)  06/02/23 216 lb (98 kg)    GEN: Healthy appearing.  Well nourished, well groomed; in no acute distress; mildly obese NECK: No JVD; No carotid bruits CARDIAC: Normal S1, S2; RRR, no murmurs, rubs, gallops RESPIRATORY:  Clear to auscultation without rales, wheezing or rhonchi ; nonlabored, good air movement. ABDOMEN: Soft, non-tender, non-distended EXTREMITIES:  No edema; No deformity      ASSESSMENT AND PLAN: .    Problem List Items Addressed This Visit       Cardiology Problems   CAD (coronary artery disease) (Chronic)   He has CAD listed as a diagnosis, but cardiac  catheterization in 2010 did not show any notable CAD.  Should not be a reason for him to not have procedures done.  Confirms that his cardiomyopathy in the past was nonischemic      Relevant Orders   EKG 12-Lead (Completed)   ECHOCARDIOGRAM COMPLETE   Hyperlipidemia LDL goal <100 (Chronic)   Most recent LDL was 108 on no medications.  Discussed dietary modification and lifestyle changes. He would like to try to avoid medications which is reasonable.  Will defer to PCP.      Nonischemic cardiomyopathy (HCC) (Chronic)   Prior history of EF roughly 40 to 45% back in 2010.  Cardiac cath showed no evidence of CAD.  In that setting he was able to exercise to 10.2 METS.  Currently notes some exertional dyspnea rushing up 2 flights of steps, but has no other symptoms.  Has not had an evaluation since 2010. He did not tolerate beta-blocker and ARB at the time, and his current pressures are 112/78, and he is euvolemic on exam.  No need for medical management at this time.  With no active CHF symptoms, there is no reason to delay going forward with colonoscopy.  We are planning to recheck an echocardiogram simply to reassess cardiac function.      Relevant Orders   EKG 12-Lead (Completed)   ECHOCARDIOGRAM COMPLETE     Other   Preop cardiovascular exam - Primary      Jeffery Frye perioperative risk of a major cardiac event is 0.4% according to the Revised Cardiac Risk Index (RCRI).  Therefore, he is at low risk for perioperative complications.   His functional capacity is good at 8.97 METs according to the Duke Activity Status Index (DASI). Recommendations: According to ACC/AHA guidelines, no further cardiovascular testing needed.  The patient may proceed to surgery at acceptable risk.   Antiplatelet and/or Anticoagulation Recommendations: Patient is not on any medications that need to be held. N/A   Revised Cardiac Risk Index:-Surgery-no, history of Ischemic Heart Disease-no, history of  CHF-no (mildly reduced EF on echo, but no CHF symptoms).  History of CVA-no, history of insulin dependent diabetes-no, preop creatinine> 2-no. => RCRI score is 0-LOW RISK      TOBACCO ABUSE (Chronic)   Current smoker, 1.5 packs/day. Advised on smoking risks with nicotine  patch use.      Relevant Orders   EKG 12-Lead (Completed)          Follow-Up: Return in about 6 months (around 01/12/2024) for Routine follow up with me, [employer].      Signed, Arleen Lacer, MD, MS Randene Bustard, M.D., M.S. Interventional Chartered certified accountant  Pager # [phone]

## 2023-07-15 ENCOUNTER — Encounter: Payer: Self-pay | Admitting: Cardiology

## 2023-07-15 DIAGNOSIS — Z01810 Encounter for preprocedural cardiovascular examination: Secondary | ICD-10-CM | POA: Insufficient documentation

## 2023-07-15 NOTE — Assessment & Plan Note (Signed)
  Mr. Card perioperative risk of a major cardiac event is 0.4% according to the Revised Cardiac Risk Index (RCRI).  Therefore, he is at low risk for perioperative complications.   His functional capacity is good at 8.97 METs according to the Duke Activity Status Index (DASI). Recommendations: According to ACC/AHA guidelines, no further cardiovascular testing needed.  The patient may proceed to surgery at acceptable risk.   Antiplatelet and/or Anticoagulation Recommendations: Patient is not on any medications that need to be held. N/A   Revised Cardiac Risk Index:-Surgery-no, history of Ischemic Heart Disease-no, history of CHF-no (mildly reduced EF on echo, but no CHF symptoms).  History of CVA-no, history of insulin dependent diabetes-no, preop creatinine> 2-no. => RCRI score is 0-LOW RISK

## 2023-07-15 NOTE — Progress Notes (Signed)
 Revised Cardiac Risk Index:-Surgery-no, history of Ischemic Heart Disease-no, history of CHF-no (mildly reduced EF on echo, but no CHF symptoms).  History of CVA-no, history of insulin dependent diabetes-no, preop creatinine> 2-no. => RCRI score is 0-LOW RISK

## 2023-07-15 NOTE — Assessment & Plan Note (Signed)
 Most recent LDL was 108 on no medications.  Discussed dietary modification and lifestyle changes. He would like to try to avoid medications which is reasonable.  Will defer to PCP.

## 2023-07-15 NOTE — Assessment & Plan Note (Signed)
 Prior history of EF roughly 40 to 45% back in 2010.  Cardiac cath showed no evidence of CAD.  In that setting he was able to exercise to 10.2 METS.  Currently notes some exertional dyspnea rushing up 2 flights of steps, but has no other symptoms.  Has not had an evaluation since 2010. He did not tolerate beta-blocker and ARB at the time, and his current pressures are 112/78, and he is euvolemic on exam.  No need for medical management at this time.  With no active CHF symptoms, there is no reason to delay going forward with colonoscopy.  We are planning to recheck an echocardiogram simply to reassess cardiac function.

## 2023-07-15 NOTE — Assessment & Plan Note (Signed)
 Current smoker, 1.5 packs/day. Advised on smoking risks with nicotine  patch use.

## 2023-07-15 NOTE — Assessment & Plan Note (Signed)
 He has CAD listed as a diagnosis, but cardiac catheterization in 2010 did not show any notable CAD.  Should not be a reason for him to not have procedures done.  Confirms that his cardiomyopathy in the past was nonischemic

## 2023-07-21 ENCOUNTER — Encounter: Admitting: Internal Medicine

## 2023-08-22 ENCOUNTER — Ambulatory Visit (HOSPITAL_COMMUNITY)
Admission: RE | Admit: 2023-08-22 | Discharge: 2023-08-22 | Disposition: A | Discharge status: Home / Self Care | Source: Ambulatory Visit | Attending: Cardiology | Admitting: Cardiology

## 2023-08-22 DIAGNOSIS — I428 Other cardiomyopathies: Secondary | ICD-10-CM | POA: Insufficient documentation

## 2023-08-22 DIAGNOSIS — I2510 Atherosclerotic heart disease of native coronary artery without angina pectoris: Secondary | ICD-10-CM | POA: Diagnosis present

## 2023-08-22 LAB — ECHOCARDIOGRAM COMPLETE
AR max vel: 2.71 cm2
AV Area VTI: 2.52 cm2
AV Area mean vel: 2.48 cm2
AV Mean grad: 4 mmHg
AV Peak grad: 7.5 mmHg
Ao pk vel: 1.37 m/s
Area-P 1/2: 2.9 cm2
Est EF: 45
S' Lateral: 3.4 cm

## 2023-08-24 ENCOUNTER — Ambulatory Visit: Payer: Self-pay | Admitting: Cardiology

## 2023-08-25 ENCOUNTER — Ambulatory Visit (HOSPITAL_COMMUNITY)

## 2023-10-13 ENCOUNTER — Other Ambulatory Visit: Payer: Self-pay | Admitting: Internal Medicine

## 2023-10-13 DIAGNOSIS — M722 Plantar fascial fibromatosis: Secondary | ICD-10-CM

## 2023-10-19 ENCOUNTER — Other Ambulatory Visit

## 2023-11-09 ENCOUNTER — Ambulatory Visit: Attending: Cardiology | Admitting: Cardiology

## 2023-11-09 ENCOUNTER — Encounter: Payer: Self-pay | Admitting: Cardiology

## 2023-11-09 VITALS — BP 128/84 | HR 74 | Ht 69.0 in | Wt 223.2 lb

## 2023-11-09 DIAGNOSIS — I428 Other cardiomyopathies: Secondary | ICD-10-CM | POA: Diagnosis not present

## 2023-11-09 DIAGNOSIS — F17200 Nicotine dependence, unspecified, uncomplicated: Secondary | ICD-10-CM | POA: Diagnosis not present

## 2023-11-09 DIAGNOSIS — E785 Hyperlipidemia, unspecified: Secondary | ICD-10-CM | POA: Diagnosis not present

## 2023-11-09 DIAGNOSIS — M7989 Other specified soft tissue disorders: Secondary | ICD-10-CM

## 2023-11-09 DIAGNOSIS — I82521 Chronic embolism and thrombosis of right iliac vein: Secondary | ICD-10-CM

## 2023-11-09 DIAGNOSIS — R002 Palpitations: Secondary | ICD-10-CM

## 2023-11-09 MED ORDER — NICOTINE 21 MG/24HR TD PT24: 21.0000 mg | MEDICATED_PATCH | Freq: Every day | TRANSDERMAL | 2 refills | Status: AC

## 2023-11-09 NOTE — Patient Instructions (Signed)
 Medication Instructions:  No changes  *If you need a refill on your cardiac medications before your next appointment, please call your pharmacy*   Lab Work: Not needed    Testing/Procedures: Not needed   Follow-Up: At [facility], you and your health needs are our priority.  As part of our continuing mission to provide you with exceptional heart care, we have created designated Provider Care Teams.  These Care Teams include your primary Cardiologist (physician) and Advanced Practice Providers (APPs -  Physician Assistants and Nurse Practitioners) who all work together to provide you with the care you need, when you need it.     Your next appointment:   12 month(s)  The format for your next appointment:   In Person  Provider:   Lawana Pray, NP      Then, Randene Bustard, MD will plan to see you again in 24  month(s).

## 2023-11-09 NOTE — Progress Notes (Signed)
 Cardiology Office Note:  .   Date:  11/12/2023  ID:  Jeffery Frye, DOB September 30, 1955, MRN [id number] PCP: [facility]  [facility] Providers Cardiologist:  Alm Clay, MD     Chief Complaint  Patient presents with   Follow-up    Doing relatively well.  No change in symptoms.   Cardiomyopathy    Mildly reduced EF on echo.  Here to discuss test results.    Patient Profile: .     Jeffery Frye is a mildly obese 68 y.o. male CURRENT SMOKER with a PMH notable for NICM & Prior DVT who presents here for 4 month f/u at the request of [facility]  PMH: Nonischemic Cardiomyopathy - Current EF stable @ ~45% April 2010: Seen by Dr. Ezra Shuck in for nonischemic cardiomyopathy of uncertain etiology.   EF by echo was 45%-Global HK.   ETT Myoview showed EF 44% with fixed inferior defect thought to be diaphragmatic attenuation =>  Cardiac Cath showed no significant CAD. HIV, ANA, SPEP all ordered-normal. His father died of CHF of unknown etiology.  Thrombophilia with history of DVT (no longer on Eliquis )  Smoker      Jeffery Frye was seen to reestablish care on July 13, 2023 after he had been lost to follow-up and had not been taking new medications.  He was noted to be episodes of near syncope a few times over the last 6 months but never actually passed out.  Not associate with heart rates or palpitations and episodes lasting maybe a couple seconds.  He noted dyspnea on exertion after walking up 2 flights of stairs with stop or catch his breath.  No chest pain.  This is a longstanding symptom with no change.  No problems walking on flat ground.  No PND orthopnea.  No notable edema.  Had history of right leg DVT, treated with anticoagulation for 3 months and wears compression stockings.  Still smoking but down about half pack a day but did stop alcohol consumption for a few days.  Subjective  Discussed the use of AI scribe software for  clinical note transcription with the patient, who gave verbal consent to proceed.  History of Present Illness Jeffery Frye is a 68 year old male who presents for follow-up on echocardiogram results.  He reports that his echocardiogram results have been similar to those from 2010, with a mildly reduced ejection fraction of about 45%.  He has a history of smoking and has recently reduced his smoking from a pack and a half a day to about a pack every other day. He attempted to quit smoking using patches but ran out of them. He is aware of the impact of smoking on his vascular health, particularly concerning the arteries in his ankles.  He experiences foot pain attributed to plantar fasciitis, which is alleviated by using NSAID insoles. 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  He reports occasional swelling in his legs, particularly after prolonged sitting, and notes a ring around his ankle from wearing socks. No significant shortness of breath, chest pain, or palpitations, although he occasionally feels his heart 'might go through something.' He experiences transient episodes of visual brightness without associated syncope or significant cardiac symptoms.  No history of sudden onset weakness, numbness, slurred speech, or drooling. He occasionally experiences stiffness upon standing, which improves with movement, and sometimes feels hip discomfort while  mowing the lawn.  No significant alcohol use, consuming one or two beers occasionally. He uses albuterol  as needed and takes baby aspirin regularly. He also uses an over-the-counter supplement for nerve pain in his feet, taking one or two pills daily instead of the recommended three.  Cardiovascular ROS: positive for - dyspnea on exertion, edema, irregular heartbeat, and pretty much stable DOE with walking up 2 flights of steps; trivial occasional swelling in ankles; very  rare irregular heart skipping negative for - chest pain, orthopnea, paroxysmal nocturnal dyspnea, rapid heart rate, shortness of breath, or syncope/near syncope (just rare lightheaded spells), TIA/amaurosis fugax, claudication.  ROS:  Review of Systems - Negative except Sx noted above    Objective   Current Meds  Medication Sig   aspirin EC 81 MG tablet Take 81 mg by mouth daily. Swallow whole.   tadalafil (CIALIS) 20 MG tablet Take 20 mg by mouth as needed.    Studies Reviewed: SABRA        Labs: 03-23-2023: TC 178, HDL 42, LDL 108, TG 158.  Hgb 13.6, K+ 4.2, Lab Results  Component Value Date   NA 138 01/27/2023   K 4.0 01/27/2023   CREATININE 0.88 01/27/2023   GFRNONAA >60 01/27/2023   GLUCOSE 113 (H) 01/27/2023   No results found for: HGBA1C  Results DIAGNOSTIC Echocardiogram: Mildly reduced with EF ~45%-noted apical septal hypokinesis-cannot exclude foreshortening.  Aortic valve calcification/sclerosis but no stenosis.  Normal mitral valve.  Cannot exclude small PFO.  Normal RAP (08/22/2023):   -  Echo (04/2008):  EF 45%, mild diffuse hypokinesis, no significant valvular abnormalities.  - ETT-myoview (04/2008):  8'1 exercise, 93% MPHR, 10.2 METS, EF 44%, mild diffuse hypokinesis.  There is a fixed inferior defect that may be diaphragmatic attentuation.  - Cardiac Cath (05/15/2008): EF 40 to 45%.  Right dominant coronary system with no angiographic CAD.   Risk Assessment/Calculations:           Physical Exam:   VS:  BP 128/84   Pulse 74   Ht 5' 9 (1.753 m)   Wt 223 lb 3.2 oz (101.2 kg)   SpO2 95%   BMI 32.96 kg/m    Wt Readings from Last 3 Encounters:  11/09/23 223 lb 3.2 oz (101.2 kg)  07/13/23 217 lb (98.4 kg)  07/13/23 217 lb 3.2 oz (98.5 kg)      GEN: Well nourished, well groomed in no acute distress; healthy-appearing NECK: No JVD; No carotid bruits CARDIAC: Normal S1, S2; RRR, no murmurs, rubs, gallops RESPIRATORY:  Clear to auscultation without  rales, wheezing or rhonchi ; nonlabored, good air movement. ABDOMEN: Soft, non-tender, non-distended EXTREMITIES:  No edema; No deformity      ASSESSMENT AND PLAN: .    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  Completed 3 months of of DOAC.  Now on maintenance dose aspirin 81 mg.      Hyperlipidemia LDL goal <100 (Chronic)   Labs being followed by PCPs office.  LDL 108.  Not unreasonable considering he is on a lot of medications and had relative normal coronaries by cath 15 years ago.  However with a smoking history, would probably try should be LDL less than 100 and possible.  Defer to PCP as he is due to have labs checked soon => low threshold  to consider statin.      Nonischemic cardiomyopathy (HCC) - Primary (Chronic)   Longstanding cardiomyopathy without any active heart failure symptoms besides mild exertional dyspnea with vigorous exertion. His blood pressure is well-controlled and he would prefer to avoid medications. - Annual follow-up with cardiology team, alternating between PA/nurse practitioner and cardiologist. - Low threshold to initiate at a minimum ARB and possibly spironolactone        Other   Intermittent palpitations   Intermittent palpitations with occasional premature beats. No syncope or significant symptoms. - Monitor and as long do not get worse, we can hold off on beta-blocker.      Localized swelling of lower extremity   Mild, intermittent peripheral edema after prolonged sitting. No significant heart failure symptoms associated. - Continue wearing compression stockings as needed.      TOBACCO ABUSE (Chronic)   He has cut down some of the smoking but still needs to quit more. Smoking cessation instruction/counseling given:  counseled patient on the dangers of tobacco use, advised patient to stop smoking,  and reviewed strategies to maximize success               Follow-Up: Return in about 1 year (around 11/08/2024) for Follow-up with APP, Alternating annual follow-ups APP and MD.     Signed, Alm MICAEL Clay, MD, MS Alm Clay, M.D., M.S. Interventional Cardiologist  [facility] Pager # [phone]

## 2023-11-12 ENCOUNTER — Encounter: Payer: Self-pay | Admitting: Cardiology

## 2023-11-12 DIAGNOSIS — R002 Palpitations: Secondary | ICD-10-CM | POA: Insufficient documentation

## 2023-11-12 DIAGNOSIS — M7989 Other specified soft tissue disorders: Secondary | ICD-10-CM | POA: Insufficient documentation

## 2023-11-12 NOTE — Assessment & Plan Note (Signed)
 Mild, intermittent peripheral edema after prolonged sitting. No significant heart failure symptoms associated. - Continue wearing compression stockings as needed.

## 2023-11-12 NOTE — Assessment & Plan Note (Signed)
 Intermittent palpitations with occasional premature beats. No syncope or significant symptoms. - Monitor and as long do not get worse, we can hold off on beta-blocker.

## 2023-11-12 NOTE — Assessment & Plan Note (Signed)
 He has cut down some of the smoking but still needs to quit more. Smoking cessation instruction/counseling given:  counseled patient on the dangers of tobacco use, advised patient to stop smoking, and reviewed strategies to maximize success

## 2023-11-12 NOTE — Assessment & Plan Note (Signed)
 Longstanding cardiomyopathy without any active heart failure symptoms besides mild exertional dyspnea with vigorous exertion. His blood pressure is well-controlled and he would prefer to avoid medications. - Annual follow-up with cardiology team, alternating between PA/nurse practitioner and cardiologist. - Low threshold to initiate at a minimum ARB and possibly spironolactone

## 2023-11-12 NOTE — Assessment & Plan Note (Signed)
 History of DVT in the right leg.  Completed 3 months of of DOAC.  Now on maintenance dose aspirin 81 mg.

## 2023-11-12 NOTE — Assessment & Plan Note (Signed)
 Labs being followed by PCPs office.  LDL 108.  Not unreasonable considering he is on a lot of medications and had relative normal coronaries by cath 15 years ago.  However with a smoking history, would probably try should be LDL less than 100 and possible.  Defer to PCP as he is due to have labs checked soon => low threshold to consider statin.

## 2024-07-11 ENCOUNTER — Ambulatory Visit: Admitting: Urology
